# Patient Record
Sex: Female | Born: 1980 | Race: Black or African American | Hispanic: No | Marital: Single | Smoking: Never smoker
Health system: Southern US, Community
[De-identification: ages and names within clinical notes are randomized; demographics above are authoritative.]

## PROBLEM LIST (undated history)

## (undated) DIAGNOSIS — D649 Anemia, unspecified: Secondary | ICD-10-CM

## (undated) DIAGNOSIS — D219 Benign neoplasm of connective and other soft tissue, unspecified: Secondary | ICD-10-CM

## (undated) HISTORY — PX: TUBAL LIGATION: SHX77

## (undated) HISTORY — DX: Anemia, unspecified: D64.9

## (undated) HISTORY — DX: Benign neoplasm of connective and other soft tissue, unspecified: D21.9

---
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
## 2000-09-25 ENCOUNTER — Inpatient Hospital Stay (HOSPITAL_COMMUNITY): Admission: AD | Admit: 2000-09-25 | Discharge: 2000-09-25 | Payer: Self-pay | Admitting: *Deleted

## 2000-09-25 ENCOUNTER — Encounter: Payer: Self-pay | Admitting: Obstetrics

## 2000-09-27 ENCOUNTER — Inpatient Hospital Stay (HOSPITAL_COMMUNITY): Admission: AD | Admit: 2000-09-27 | Discharge: 2000-09-27 | Payer: Self-pay | Admitting: Gynecology

## 2000-09-30 ENCOUNTER — Encounter (HOSPITAL_COMMUNITY): Admission: RE | Admit: 2000-09-30 | Discharge: 2000-10-01 | Payer: Self-pay | Admitting: *Deleted

## 2000-10-02 ENCOUNTER — Inpatient Hospital Stay (HOSPITAL_COMMUNITY): Admission: AD | Admit: 2000-10-02 | Discharge: 2000-10-04 | Payer: Self-pay | Admitting: *Deleted

## 2002-07-09 ENCOUNTER — Inpatient Hospital Stay (HOSPITAL_COMMUNITY): Admission: AD | Admit: 2002-07-09 | Discharge: 2002-07-09 | Payer: Self-pay | Admitting: *Deleted

## 2003-01-09 ENCOUNTER — Emergency Department (HOSPITAL_COMMUNITY): Admission: EM | Admit: 2003-01-09 | Discharge: 2003-01-10 | Payer: Self-pay | Admitting: Emergency Medicine

## 2003-01-09 ENCOUNTER — Encounter (INDEPENDENT_AMBULATORY_CARE_PROVIDER_SITE_OTHER): Payer: Self-pay | Admitting: *Deleted

## 2003-01-10 ENCOUNTER — Encounter: Payer: Self-pay | Admitting: Emergency Medicine

## 2003-04-20 ENCOUNTER — Inpatient Hospital Stay (HOSPITAL_COMMUNITY): Admission: AD | Admit: 2003-04-20 | Discharge: 2003-04-20 | Payer: Self-pay | Admitting: Family Medicine

## 2003-04-20 ENCOUNTER — Encounter: Payer: Self-pay | Admitting: Family Medicine

## 2003-07-17 ENCOUNTER — Ambulatory Visit (HOSPITAL_COMMUNITY): Admission: RE | Admit: 2003-07-17 | Discharge: 2003-07-17 | Payer: Self-pay | Admitting: Obstetrics & Gynecology

## 2003-07-30 ENCOUNTER — Inpatient Hospital Stay (HOSPITAL_COMMUNITY): Admission: AD | Admit: 2003-07-30 | Discharge: 2003-07-31 | Payer: Self-pay | Admitting: *Deleted

## 2003-10-13 ENCOUNTER — Inpatient Hospital Stay (HOSPITAL_COMMUNITY): Admission: AD | Admit: 2003-10-13 | Discharge: 2003-10-13 | Payer: Self-pay | Admitting: Obstetrics & Gynecology

## 2003-10-15 ENCOUNTER — Inpatient Hospital Stay (HOSPITAL_COMMUNITY): Admission: AD | Admit: 2003-10-15 | Discharge: 2003-10-15 | Payer: Self-pay | Admitting: Obstetrics

## 2003-10-16 ENCOUNTER — Ambulatory Visit (HOSPITAL_COMMUNITY): Admission: AD | Admit: 2003-10-16 | Discharge: 2003-10-16 | Payer: Self-pay | Admitting: Obstetrics & Gynecology

## 2003-10-19 ENCOUNTER — Inpatient Hospital Stay (HOSPITAL_COMMUNITY): Admission: AD | Admit: 2003-10-19 | Discharge: 2003-10-25 | Payer: Self-pay | Admitting: Obstetrics

## 2004-08-04 ENCOUNTER — Emergency Department (HOSPITAL_COMMUNITY): Admission: EM | Admit: 2004-08-04 | Discharge: 2004-08-04 | Payer: Self-pay

## 2005-01-14 ENCOUNTER — Emergency Department (HOSPITAL_COMMUNITY): Admission: EM | Admit: 2005-01-14 | Discharge: 2005-01-14 | Payer: Self-pay | Admitting: Family Medicine

## 2005-12-20 ENCOUNTER — Emergency Department (HOSPITAL_COMMUNITY): Admission: EM | Admit: 2005-12-20 | Discharge: 2005-12-20 | Payer: Self-pay | Admitting: Emergency Medicine

## 2006-05-24 ENCOUNTER — Emergency Department (HOSPITAL_COMMUNITY): Admission: EM | Admit: 2006-05-24 | Discharge: 2006-05-25 | Payer: Self-pay | Admitting: Emergency Medicine

## 2007-01-21 ENCOUNTER — Emergency Department (HOSPITAL_COMMUNITY): Admission: EM | Admit: 2007-01-21 | Discharge: 2007-01-21 | Payer: Self-pay | Admitting: Emergency Medicine

## 2007-09-18 ENCOUNTER — Emergency Department (HOSPITAL_COMMUNITY): Admission: EM | Admit: 2007-09-18 | Discharge: 2007-09-18 | Payer: Self-pay | Admitting: Emergency Medicine

## 2007-09-20 ENCOUNTER — Inpatient Hospital Stay (HOSPITAL_COMMUNITY): Admission: AD | Admit: 2007-09-20 | Discharge: 2007-09-21 | Payer: Self-pay | Admitting: Gynecology

## 2007-11-27 ENCOUNTER — Emergency Department (HOSPITAL_COMMUNITY): Admission: EM | Admit: 2007-11-27 | Discharge: 2007-11-27 | Payer: Self-pay | Admitting: Emergency Medicine

## 2008-08-27 ENCOUNTER — Inpatient Hospital Stay (HOSPITAL_COMMUNITY): Admission: AD | Admit: 2008-08-27 | Discharge: 2008-08-27 | Payer: Self-pay | Admitting: Obstetrics & Gynecology

## 2008-11-10 ENCOUNTER — Emergency Department (HOSPITAL_COMMUNITY): Admission: EM | Admit: 2008-11-10 | Discharge: 2008-11-10 | Payer: Self-pay | Admitting: Emergency Medicine

## 2009-02-25 ENCOUNTER — Inpatient Hospital Stay (HOSPITAL_COMMUNITY): Admission: AD | Admit: 2009-02-25 | Discharge: 2009-02-26 | Payer: Self-pay | Admitting: Obstetrics & Gynecology

## 2009-08-06 ENCOUNTER — Inpatient Hospital Stay (HOSPITAL_COMMUNITY): Admission: AD | Admit: 2009-08-06 | Discharge: 2009-08-06 | Payer: Self-pay | Admitting: Obstetrics & Gynecology

## 2009-08-18 ENCOUNTER — Inpatient Hospital Stay (HOSPITAL_COMMUNITY): Admission: AD | Admit: 2009-08-18 | Discharge: 2009-08-18 | Payer: Self-pay | Admitting: Obstetrics and Gynecology

## 2009-10-25 ENCOUNTER — Ambulatory Visit (HOSPITAL_COMMUNITY): Admission: RE | Admit: 2009-10-25 | Discharge: 2009-10-25 | Payer: Self-pay | Admitting: Obstetrics & Gynecology

## 2009-10-26 ENCOUNTER — Inpatient Hospital Stay (HOSPITAL_COMMUNITY): Admission: AD | Admit: 2009-10-26 | Discharge: 2009-10-26 | Payer: Self-pay | Admitting: Maternal and Fetal Medicine

## 2009-11-22 ENCOUNTER — Ambulatory Visit (HOSPITAL_COMMUNITY): Admission: RE | Admit: 2009-11-22 | Discharge: 2009-11-22 | Payer: Self-pay | Admitting: Obstetrics & Gynecology

## 2009-12-20 ENCOUNTER — Ambulatory Visit (HOSPITAL_COMMUNITY): Admission: RE | Admit: 2009-12-20 | Discharge: 2009-12-20 | Payer: Self-pay | Admitting: Obstetrics & Gynecology

## 2010-01-12 ENCOUNTER — Inpatient Hospital Stay (HOSPITAL_COMMUNITY): Admission: AD | Admit: 2010-01-12 | Discharge: 2010-01-12 | Payer: Self-pay | Admitting: Obstetrics & Gynecology

## 2010-01-12 ENCOUNTER — Ambulatory Visit: Payer: Self-pay | Admitting: Advanced Practice Midwife

## 2010-01-17 ENCOUNTER — Ambulatory Visit (HOSPITAL_COMMUNITY): Admission: RE | Admit: 2010-01-17 | Discharge: 2010-01-17 | Payer: Self-pay | Admitting: Obstetrics & Gynecology

## 2010-01-21 ENCOUNTER — Inpatient Hospital Stay (HOSPITAL_COMMUNITY): Admission: AD | Admit: 2010-01-21 | Discharge: 2010-01-21 | Payer: Self-pay | Admitting: Obstetrics

## 2010-01-26 ENCOUNTER — Inpatient Hospital Stay (HOSPITAL_COMMUNITY): Admission: AD | Admit: 2010-01-26 | Discharge: 2010-01-27 | Payer: Self-pay | Admitting: Obstetrics & Gynecology

## 2010-02-14 ENCOUNTER — Ambulatory Visit (HOSPITAL_COMMUNITY): Admission: RE | Admit: 2010-02-14 | Discharge: 2010-02-14 | Payer: Self-pay | Admitting: Obstetrics & Gynecology

## 2010-02-23 ENCOUNTER — Ambulatory Visit: Payer: Self-pay | Admitting: Nurse Practitioner

## 2010-02-23 ENCOUNTER — Inpatient Hospital Stay (HOSPITAL_COMMUNITY): Admission: AD | Admit: 2010-02-23 | Discharge: 2010-02-24 | Payer: Self-pay | Admitting: Obstetrics & Gynecology

## 2010-03-06 ENCOUNTER — Inpatient Hospital Stay (HOSPITAL_COMMUNITY): Admission: AD | Admit: 2010-03-06 | Discharge: 2010-03-10 | Payer: Self-pay | Admitting: Obstetrics & Gynecology

## 2010-03-07 ENCOUNTER — Encounter: Payer: Self-pay | Admitting: Obstetrics & Gynecology

## 2010-08-11 ENCOUNTER — Emergency Department (HOSPITAL_COMMUNITY)
Admission: EM | Admit: 2010-08-11 | Discharge: 2010-08-11 | Payer: Self-pay | Source: Home / Self Care | Admitting: Emergency Medicine

## 2010-10-23 IMAGING — US US OB EACH ADDL GEST<[ID]
1 series · 13 of 27 positions shown · non-contrast
Comparison: Ultrasound of pregnancy performed 08/06/2009

CLINICAL DATA: Vaginal bleeding and lower back pain.

OBSTETRIC <14 WK ULTRASOUND (TWINS)
TECHNIQUE: Transabdominal ultrasound was performed for evaluation
of the gestation as well as the maternal uterus and adnexal
regions.

[Series 1: us ob comp less 14 wks · 0.27mm/px · 13 of 27 slices shown]
[im 2/27]
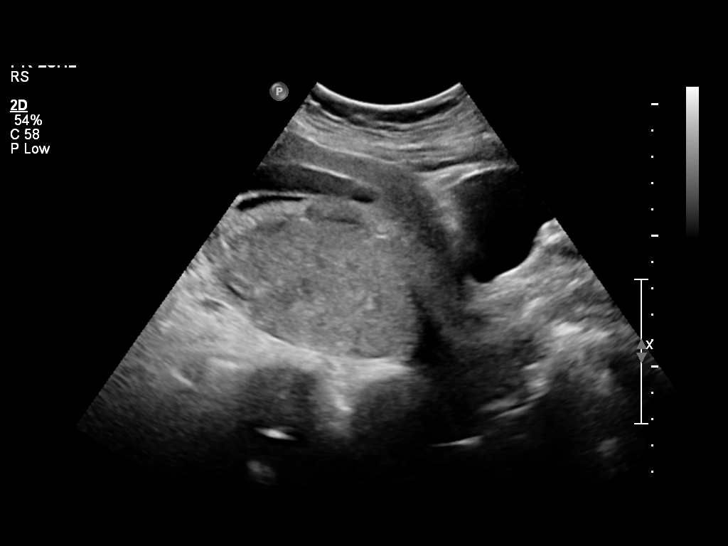
[im 4/27]
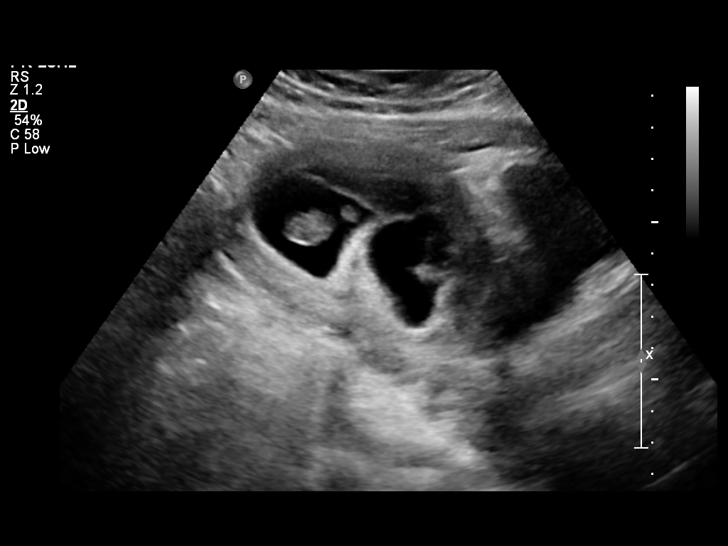
[im 6/27]
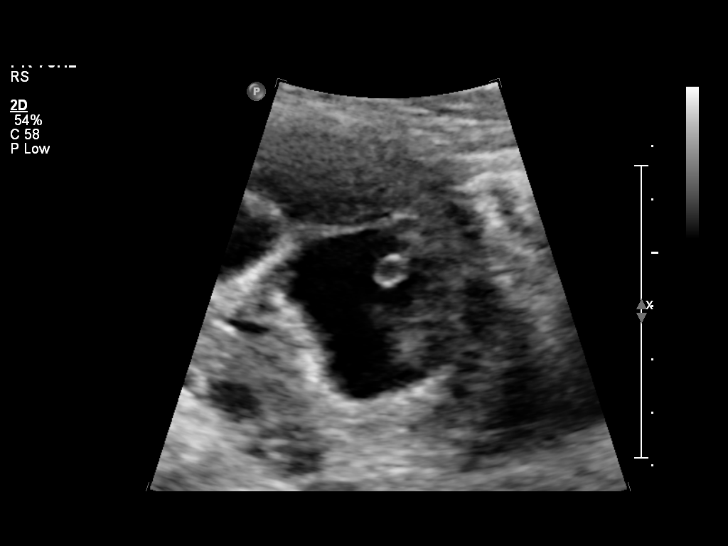
[im 8/27]
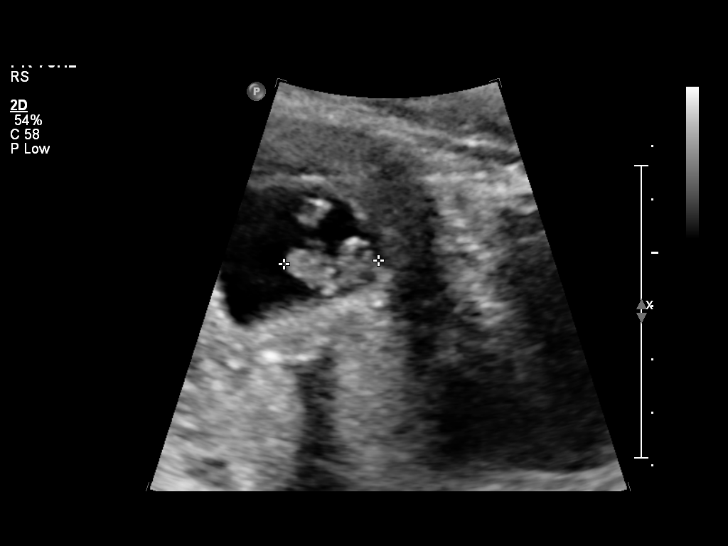
[im 10/27]
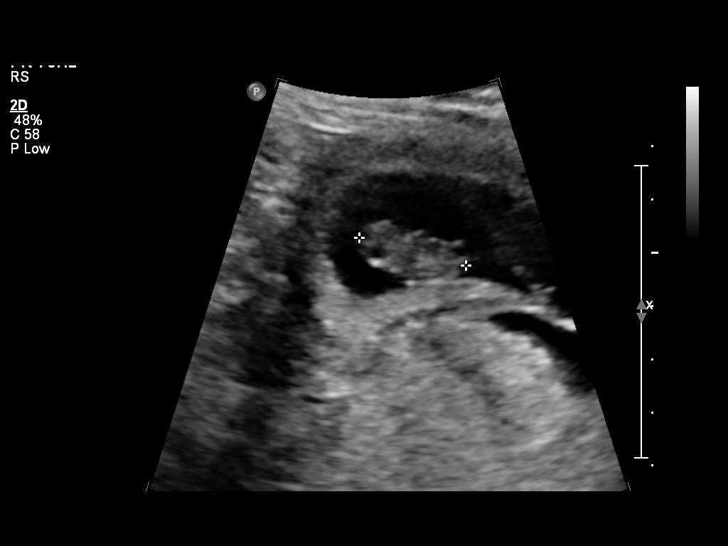
[im 12/27]
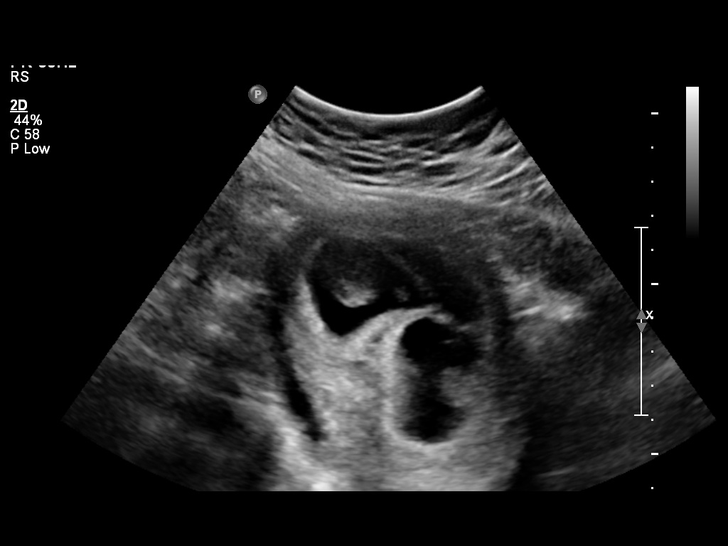
[im 14/27]
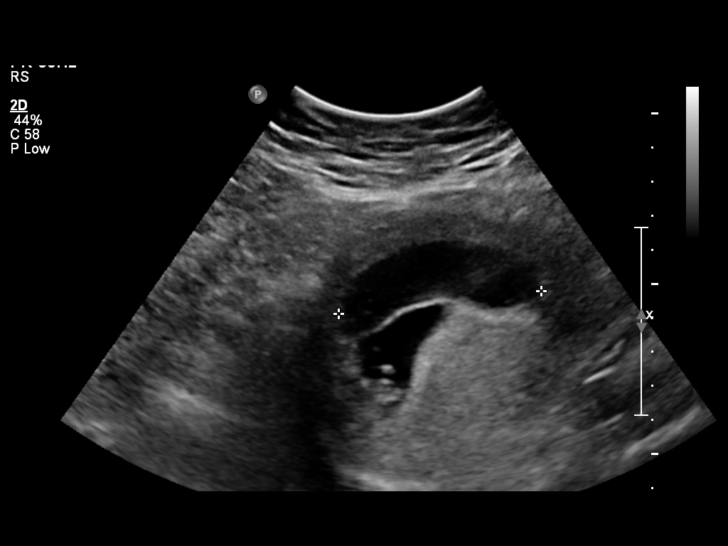
[im 16/27]
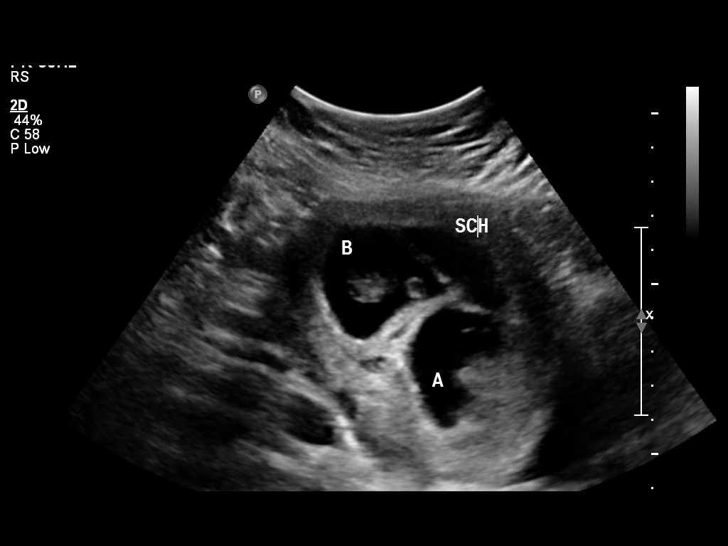
[im 18/27]
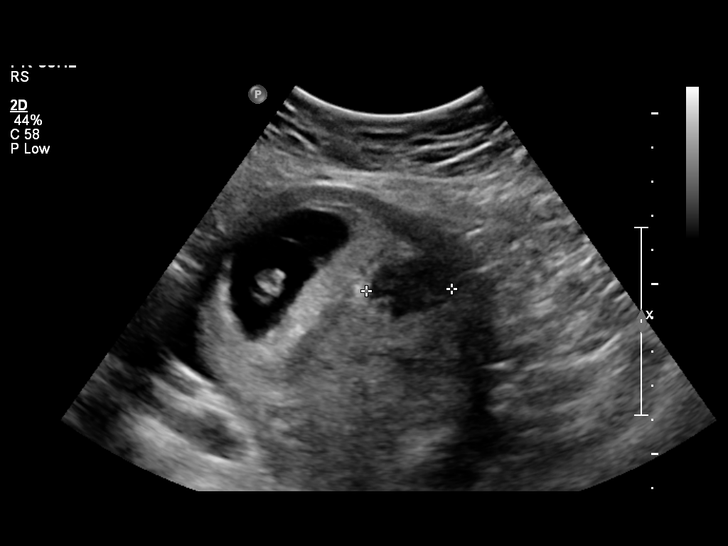
[im 20/27]
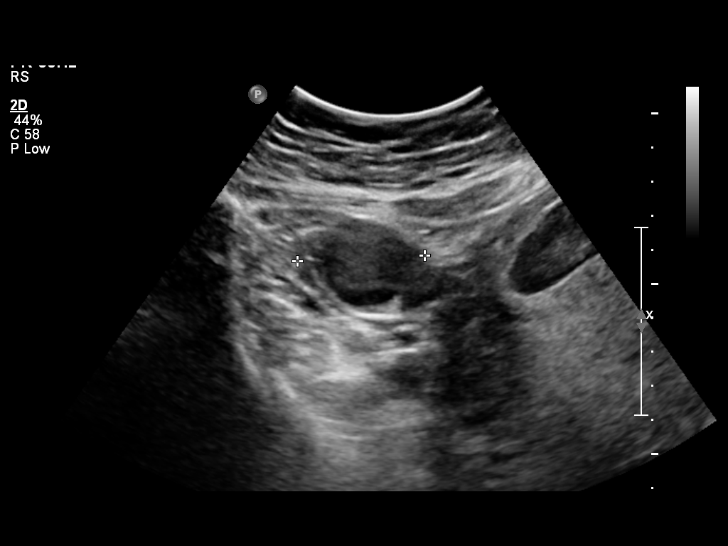
[im 22/27]
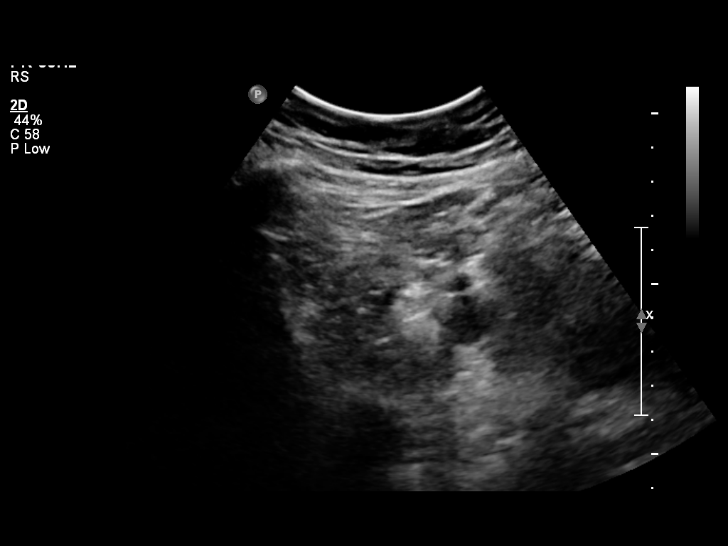
[im 24/27]
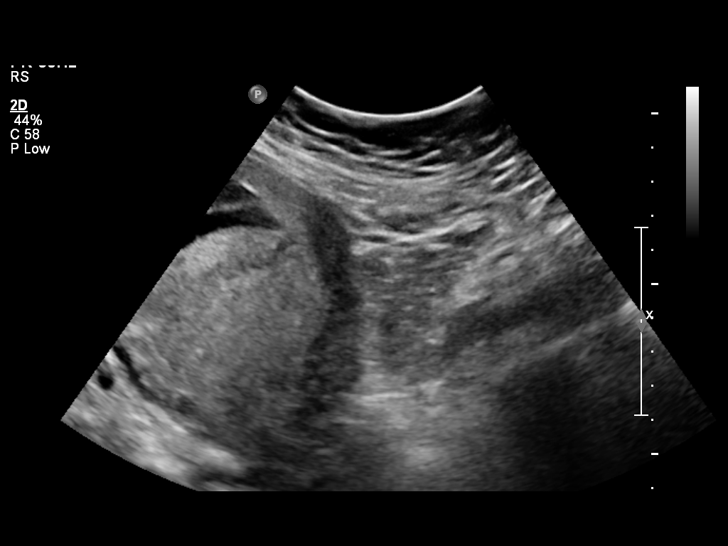
[im 26/27]
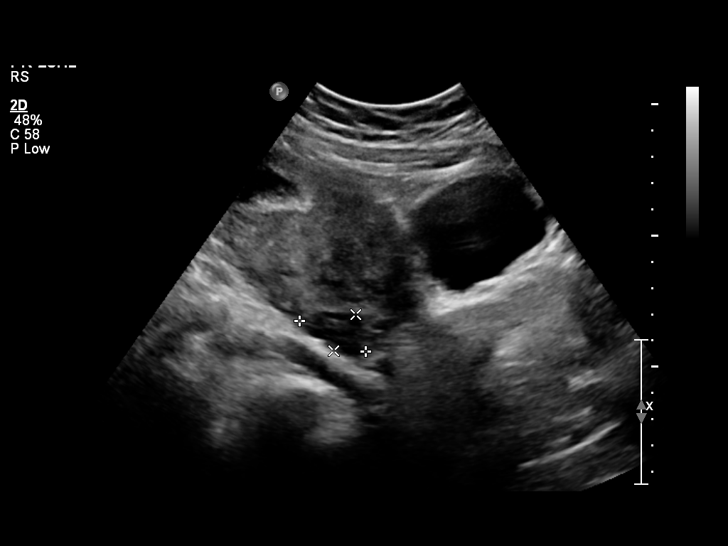

[13 of 27 positions shown; findings below may reference images not displayed]

FINDINGS: Two intrauterine gestational sacs are identified.
Embryos are seen within both intrauterine gestational sacs.  Twin A
has a crown-rump length of 1.93 cm, corresponding to a gestational
age of 8 weeks 4 days.  Twin B has a crown-rump length of 2.07 cm,
corresponding to a gestational age of 8 weeks 5 days.  Cardiac
activity is noted within both twins.  The heart rate of twin A is
169 beats per minute, while the heart rate of twin B is 170 beats
per minute.  Yolk sacs are noted for both twin A and twin B.

A moderate amount of subchorionic hemorrhage is again noted.  This
measures approximately 6.0 x 1.3 x 4.7 cm, perhaps slightly less
prominent than on the prior study. A small fibroid is again
identified.  Myometrial obvious is otherwise within normal limits.

The ovaries are unremarkable in appearance.  A corpus luteal cyst
is noted on the right side.  The right ovary measures 5.1 x 2.8 x
3.7 cm, while the left ovary measures 2.8 x 1.6 x 1.9 cm.  No
suspicious adnexal masses are seen.  There is no evidence of
ectopic pregnancy at this time.

No free fluid is seen within the pelvic cul-de-sac.
IMPRESSION: 1.  Two live intrauterine pregnancies identified; twin A has a
crown-rump length of 1.93 cm, corresponding to a gestational age of
8 weeks 4 days, while twin B has a crown-rump length of 2.07 cm,
corresponding to a gestational age of 8 weeks 5 days.  This
corresponds well with the first ultrasound, reflecting a estimated
date of delivery March 28, 2010.
2.  Moderate subchorionic hemorrhage again noted.
3.  Small fibroid again seen.

## 2010-11-08 LAB — CBC
HCT: 24.7 % — ABNORMAL LOW (ref 36.0–46.0)
HCT: 25.7 % — ABNORMAL LOW (ref 36.0–46.0)
Hemoglobin: 8.1 g/dL — ABNORMAL LOW (ref 12.0–15.0)
Hemoglobin: 8.4 g/dL — ABNORMAL LOW (ref 12.0–15.0)
RBC: 3.24 MIL/uL — ABNORMAL LOW (ref 3.87–5.11)
WBC: 15.6 10*3/uL — ABNORMAL HIGH (ref 4.0–10.5)
WBC: 8.3 10*3/uL (ref 4.0–10.5)

## 2010-11-08 LAB — MRSA PCR SCREENING: MRSA by PCR: NEGATIVE

## 2010-11-09 LAB — CBC
MCH: 27 pg (ref 26.0–34.0)
Platelets: 239 10*3/uL (ref 150–400)
RBC: 3.29 MIL/uL — ABNORMAL LOW (ref 3.87–5.11)
RDW: 15.4 % (ref 11.5–15.5)
WBC: 8.1 10*3/uL (ref 4.0–10.5)

## 2010-11-09 LAB — COMPREHENSIVE METABOLIC PANEL
AST: 24 U/L (ref 0–37)
Albumin: 2.7 g/dL — ABNORMAL LOW (ref 3.5–5.2)
Calcium: 8.9 mg/dL (ref 8.4–10.5)
Chloride: 103 mEq/L (ref 96–112)
Creatinine, Ser: 0.63 mg/dL (ref 0.4–1.2)
GFR calc Af Amer: >60 mL/min (ref >60)
Total Bilirubin: 1.5 mg/dL — ABNORMAL HIGH (ref 0.3–1.2)
Total Protein: 5.8 g/dL — ABNORMAL LOW (ref 6.0–8.3)

## 2010-11-09 LAB — URINE MICROSCOPIC-ADD ON

## 2010-11-09 LAB — URINALYSIS, ROUTINE W REFLEX MICROSCOPIC
Glucose, UA: NEGATIVE mg/dL
Hgb urine dipstick: NEGATIVE
Specific Gravity, Urine: 1.015 (ref 1.005–1.030)
Urobilinogen, UA: 4 mg/dL — ABNORMAL HIGH (ref 0.0–1.0)

## 2010-11-10 LAB — COMPREHENSIVE METABOLIC PANEL
Alkaline Phosphatase: 160 U/L — ABNORMAL HIGH (ref 39–117)
BUN: 2 mg/dL — ABNORMAL LOW (ref 6–23)
CO2: 24 mEq/L (ref 19–32)
Chloride: 105 mEq/L (ref 96–112)
Creatinine, Ser: 0.52 mg/dL (ref 0.4–1.2)
GFR calc non Af Amer: >60 mL/min (ref >60)
Glucose, Bld: 62 mg/dL — ABNORMAL LOW (ref 70–99)
Potassium: 4 mEq/L (ref 3.5–5.1)
Total Bilirubin: 0.7 mg/dL (ref 0.3–1.2)

## 2010-11-10 LAB — URINALYSIS, ROUTINE W REFLEX MICROSCOPIC
Bilirubin Urine: NEGATIVE
Glucose, UA: NEGATIVE mg/dL
Glucose, UA: NEGATIVE mg/dL
Glucose, UA: NEGATIVE mg/dL
Hgb urine dipstick: NEGATIVE
Hgb urine dipstick: NEGATIVE
Hgb urine dipstick: NEGATIVE
Protein, ur: NEGATIVE mg/dL
Specific Gravity, Urine: 1.02 (ref 1.005–1.030)
Specific Gravity, Urine: 1.02 (ref 1.005–1.030)
Specific Gravity, Urine: 1.025 (ref 1.005–1.030)
Urobilinogen, UA: 2 mg/dL — ABNORMAL HIGH (ref 0.0–1.0)
pH: 6 (ref 5.0–8.0)
pH: 6 (ref 5.0–8.0)

## 2010-11-10 LAB — URINE MICROSCOPIC-ADD ON

## 2010-11-10 LAB — CBC
HCT: 27.1 % — ABNORMAL LOW (ref 36.0–46.0)
Hemoglobin: 9.3 g/dL — ABNORMAL LOW (ref 12.0–15.0)
MCV: 87.8 fL (ref 78.0–100.0)
WBC: 8.9 10*3/uL (ref 4.0–10.5)

## 2010-11-10 LAB — GC/CHLAMYDIA PROBE AMP, GENITAL
Chlamydia, DNA Probe: NEGATIVE
GC Probe Amp, Genital: NEGATIVE
GC Probe Amp, Genital: NEGATIVE

## 2010-11-10 LAB — URINE CULTURE

## 2010-11-10 LAB — WET PREP, GENITAL
Trich, Wet Prep: NONE SEEN
Yeast Wet Prep HPF POC: NONE SEEN

## 2010-11-24 LAB — URINE MICROSCOPIC-ADD ON

## 2010-11-24 LAB — URINALYSIS, ROUTINE W REFLEX MICROSCOPIC
Glucose, UA: NEGATIVE mg/dL
Specific Gravity, Urine: 1.01 (ref 1.005–1.030)
Urobilinogen, UA: 0.2 mg/dL (ref 0.0–1.0)
pH: 6 (ref 5.0–8.0)

## 2010-11-25 LAB — CBC
HCT: 33.5 % — ABNORMAL LOW (ref 36.0–46.0)
Hemoglobin: 11.3 g/dL — ABNORMAL LOW (ref 12.0–15.0)
MCHC: 33.6 g/dL (ref 30.0–36.0)
MCV: 90.9 fL (ref 78.0–100.0)
RDW: 13.8 % (ref 11.5–15.5)

## 2010-11-25 LAB — WET PREP, GENITAL
Trich, Wet Prep: NONE SEEN
Yeast Wet Prep HPF POC: NONE SEEN

## 2010-11-25 LAB — POCT PREGNANCY, URINE: Preg Test, Ur: POSITIVE

## 2010-11-30 LAB — URINALYSIS, ROUTINE W REFLEX MICROSCOPIC
Glucose, UA: NEGATIVE mg/dL
Ketones, ur: NEGATIVE mg/dL
Leukocytes, UA: NEGATIVE
Protein, ur: NEGATIVE mg/dL

## 2010-11-30 LAB — CBC
Hemoglobin: 11.9 g/dL — ABNORMAL LOW (ref 12.0–15.0)
MCHC: 34.7 g/dL (ref 30.0–36.0)
RBC: 3.79 MIL/uL — ABNORMAL LOW (ref 3.87–5.11)

## 2010-11-30 LAB — URINE MICROSCOPIC-ADD ON

## 2010-11-30 LAB — POCT PREGNANCY, URINE: Preg Test, Ur: POSITIVE

## 2010-12-08 LAB — ABO/RH: ABO/RH(D): O POS

## 2010-12-08 LAB — CBC
HCT: 34.6 % — ABNORMAL LOW (ref 36.0–46.0)
Hemoglobin: 12.1 g/dL (ref 12.0–15.0)
MCHC: 35.1 g/dL (ref 30.0–36.0)
MCV: 88.5 fL (ref 78.0–100.0)
RBC: 3.91 MIL/uL (ref 3.87–5.11)
RDW: 12 % (ref 11.5–15.5)

## 2010-12-08 LAB — URINALYSIS, ROUTINE W REFLEX MICROSCOPIC
Glucose, UA: NEGATIVE mg/dL
Leukocytes, UA: NEGATIVE
Protein, ur: NEGATIVE mg/dL
Specific Gravity, Urine: 1.02 (ref 1.005–1.030)
pH: 5.5 (ref 5.0–8.0)

## 2010-12-08 LAB — URINE MICROSCOPIC-ADD ON

## 2010-12-08 LAB — WET PREP, GENITAL: Yeast Wet Prep HPF POC: NONE SEEN

## 2010-12-30 IMAGING — US US OB DETAIL EACH ADDL GEST + 14 WK
1 series · 14 of 28 positions shown · non-contrast
Comparison: none

OBSTETRICAL ULTRASOUND:
 This ultrasound was performed in The [HOSP[facility]], and the [facility] report will be stored to [REDACTED] PACS.  This report is also available in [HOSPITAL]?s accessANYware.

[Series 1: us ob detail each addl gest + 14 wk · 14 of 167 slices shown]
[im 7/167]
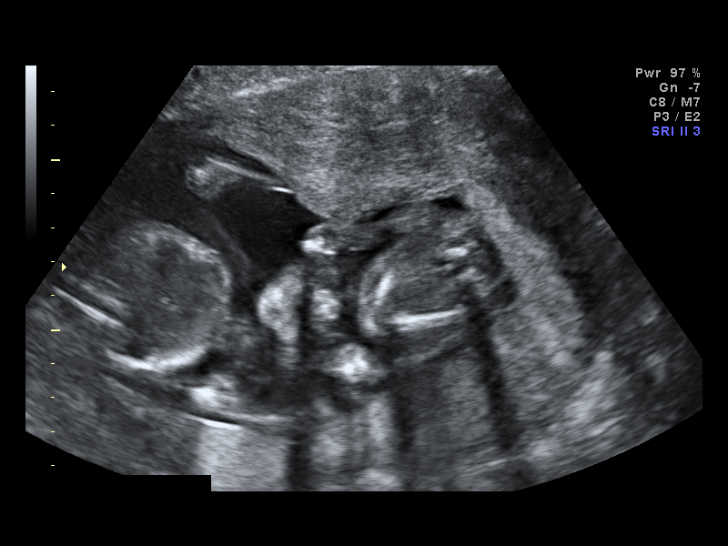
[im 19/167]
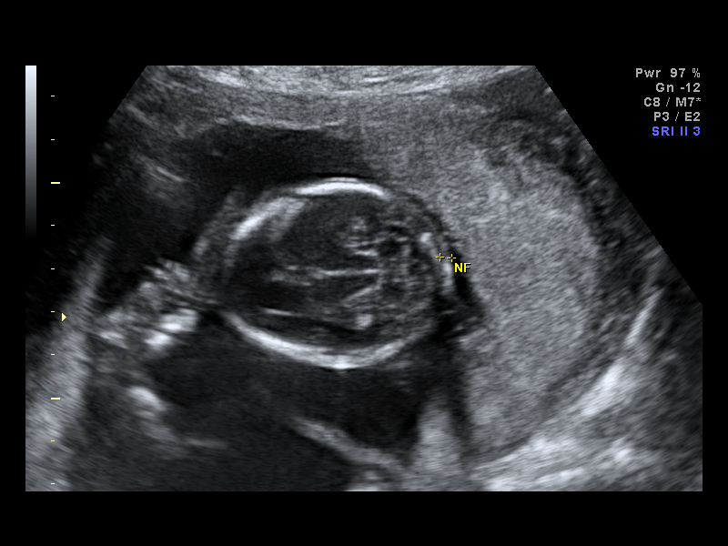
[im 31/167]
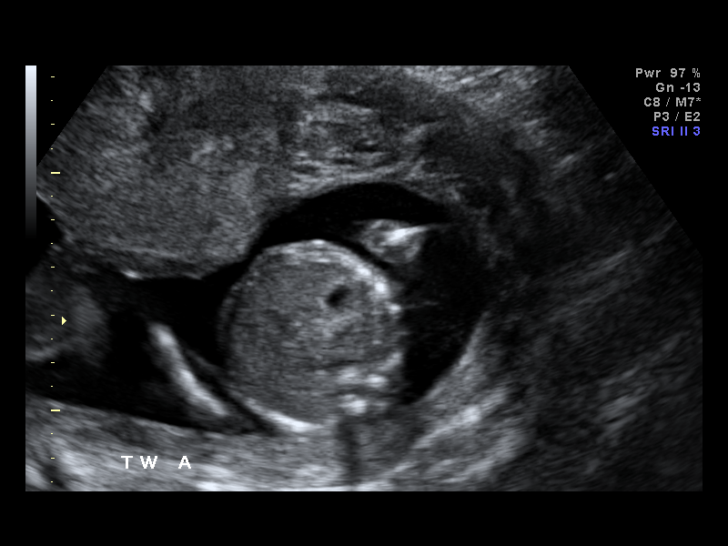
[im 44/167]
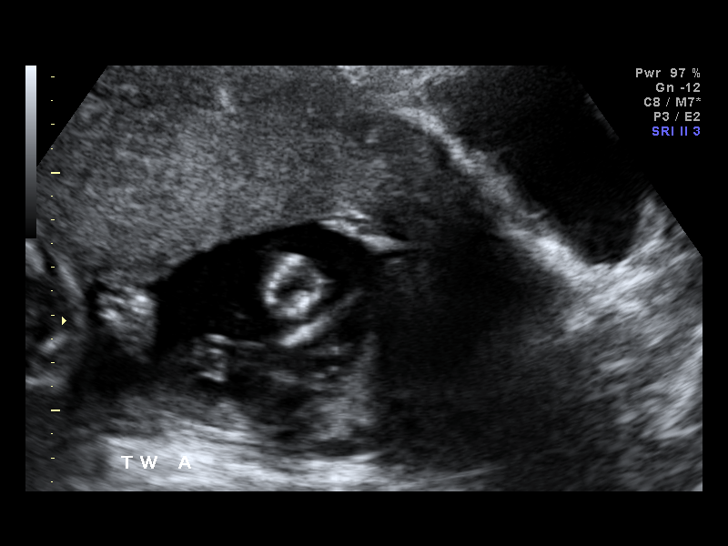
[im 56/167]
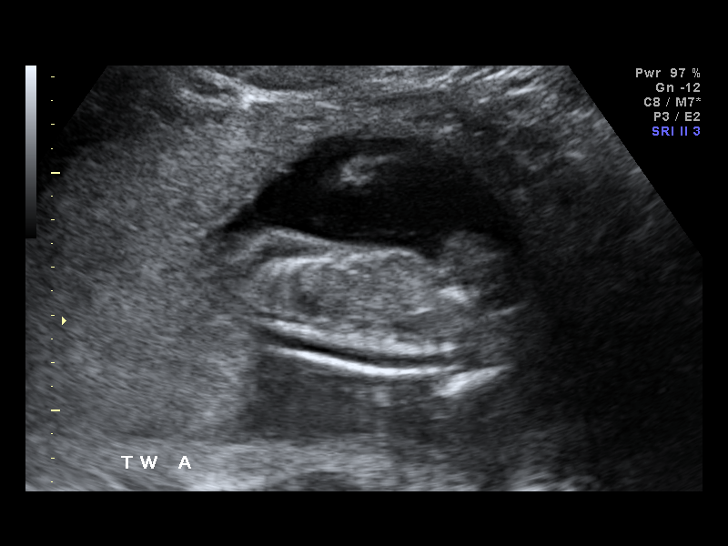
[im 68/167]
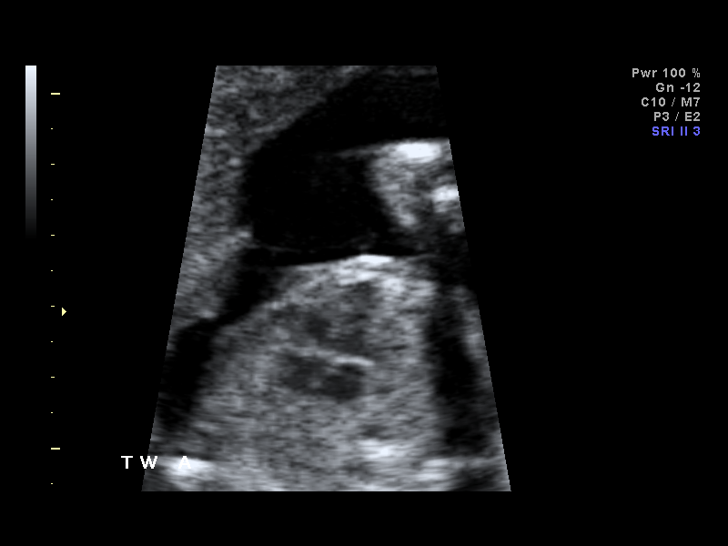
[im 80/167]
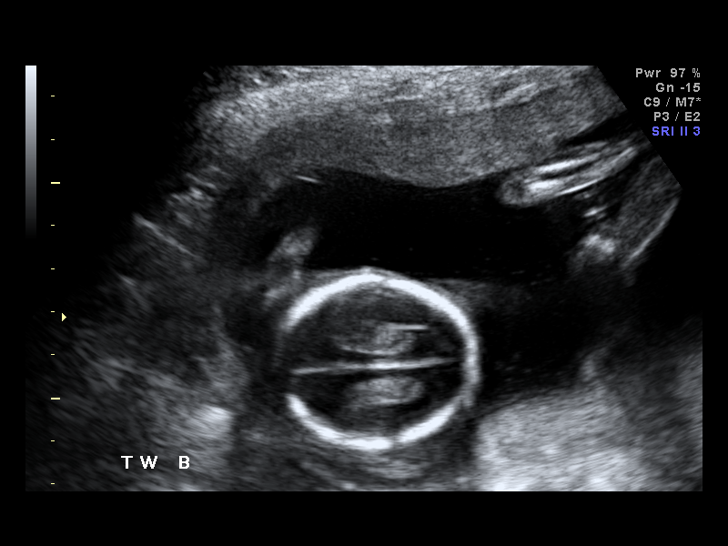
[im 93/167]
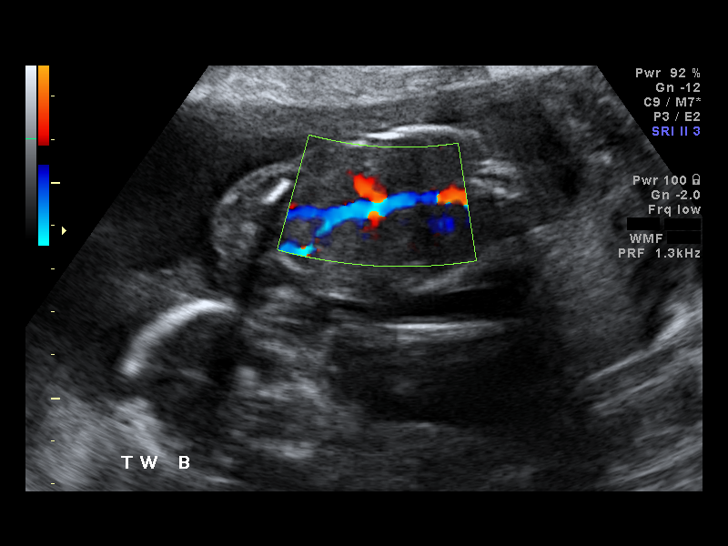
[im 105/167]
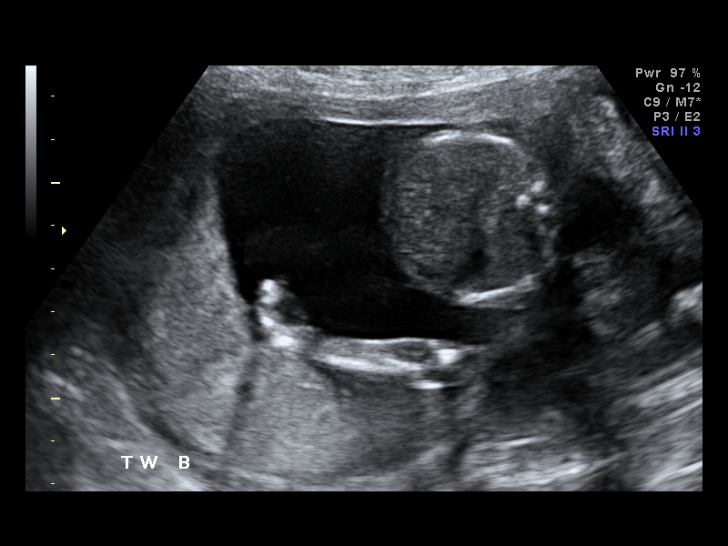
[im 117/167]
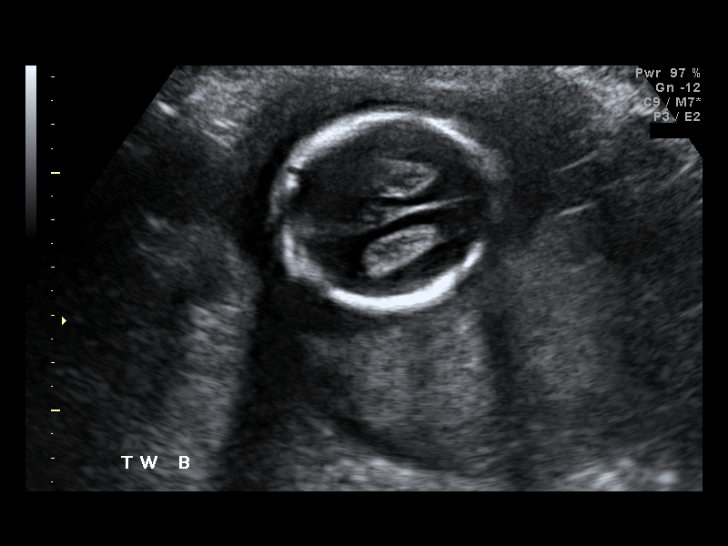
[im 130/167]
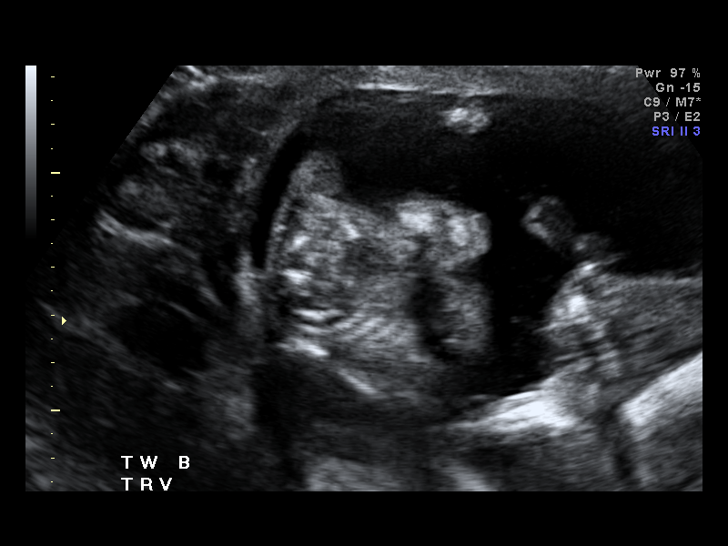
[im 142/167]
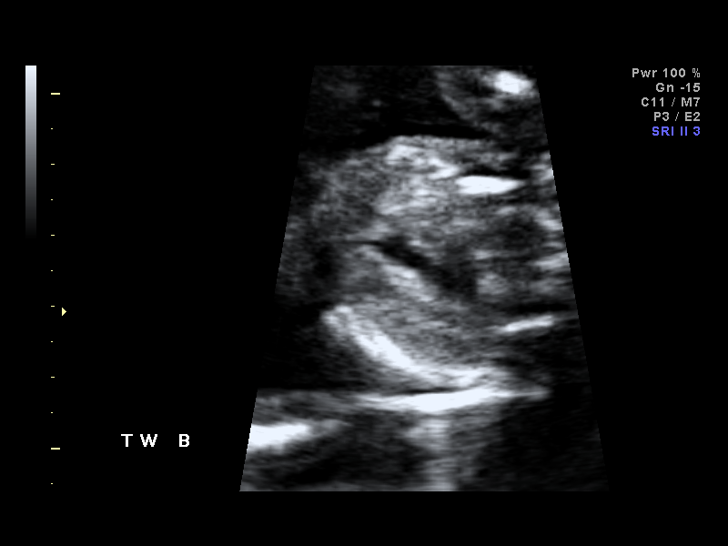
[im 154/167]
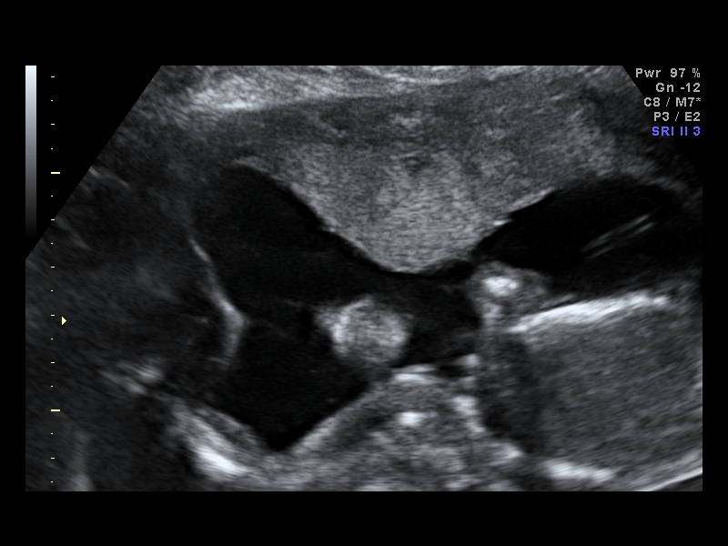
[im 167/167]
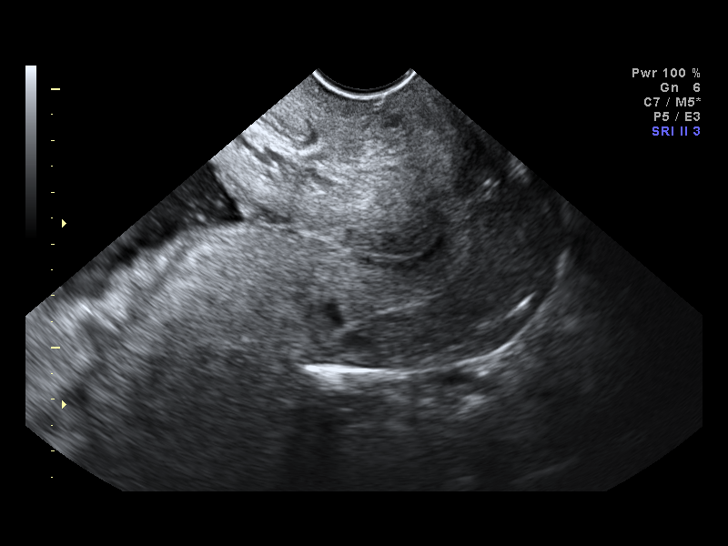

[14 of 28 positions shown; findings below may reference images not displayed]

IMPRESSION: [facility] has also been faxed to the ordering physician.

## 2011-01-09 NOTE — H&P (Signed)
Natalie Orr, Natalie Orr                     ACCOUNT NO.:  [id number]   MEDICAL RECORD NO.:  [id number]                   PATIENT TYPE:  INP   LOCATION:  9162                                 FACILITY:  [facility]   PHYSICIAN:  Charles A. Clearance Coots, M.D.             DATE OF BIRTH:  10/10/1980   DATE OF ADMISSION:  10/19/2003  DATE OF DISCHARGE:                                HISTORY & PHYSICAL   HISTORY OF PRESENT ILLNESS:  A 30 year old G8 P3-0-4-3 presents at [redacted] weeks  gestation with complaint of uterine contractions.  The patient is followed  at the [facility] with prenatal care starting at [redacted] weeks  gestation.  Her OB care thus far has been uncomplicated.   OBSTETRICAL HISTORY:  Previous obstetric history is significant for three  vaginal deliveries in 1997, in 2000, and in 2002.  In 1997 the patient  delivered at [redacted] weeks gestation and the delivery was complicated by  preeclampsia, postpartum hemorrhage.  The pregnancy was complicated by  intrauterine growth restriction.  In 2000 she delivered vaginally at [redacted]  weeks gestation.  There were no complications and the pregnancy was  uncomplicated.  In 2002 she delivered at 40 weeks vaginally and the labor  was complicated by preeclampsia.  She has had three therapeutic abortions  and one spontaneous abortion.   PAST MEDICAL HISTORY:  1. Surgery:  D&C for retained placenta in 1997.  2. Illnesses:  None.   MEDICATIONS:  Prenatal vitamins.   SOCIAL HISTORY:  Single.  Negative for tobacco, alcohol, or recreational  drug use.   PHYSICAL EXAMINATION:  GENERAL:  Well-nourished, well-developed black female  in no acute distress.  VITAL SIGNS:  Temperature 98.3, pulse 84, respiratory rate 16, blood  pressure 145/76.  HEENT:  Normal.  LUNGS:  Clear to auscultation bilaterally.  HEART:  Regular rate and rhythm.  ABDOMEN:  Gravid, nontender.  PELVIC:  Cervix 3 cm dilated, 50% effaced, vertex at a -3 to -2 station.   ADMITTING  LABORATORY VALUES:  Hemoglobin 10.6; hematocrit 31; white blood  cell count 7200; platelets 278,000.  SGOT was 13, SGPT was 9.  Glucose was  86.  Uric acid was 4.8, LDH was 142.  Urine was negative for protein,  specific gravity was 1.010.   IMPRESSION:  1. Thirty-seven weeks gestation.  2. Increased uterine activity with mild to moderate uterine contractions.  3. Pregnancy-induced hypertension, stable.   PLAN:  1. Admit.  2. Monitor blood pressures closely.  3. Rule out labor.                                               Charles A. Clearance Coots, M.D.    CAH/MEDQ  D:  10/20/2003  T:  10/20/2003  Job:  [id number]

## 2011-03-24 IMAGING — US US OB FOLLOW-UP
1 series · 14 of 28 positions shown · non-contrast
Comparison: none

OBSTETRICAL ULTRASOUND:
 This ultrasound was performed in The [facility]], and the [facility] report will be stored to [REDACTED] PACS.  This report is also available in [HOSPITAL]?s accessANYware.

[Series 1: us ob follow-up · 14 of 90 slices shown]
[im 4/90]
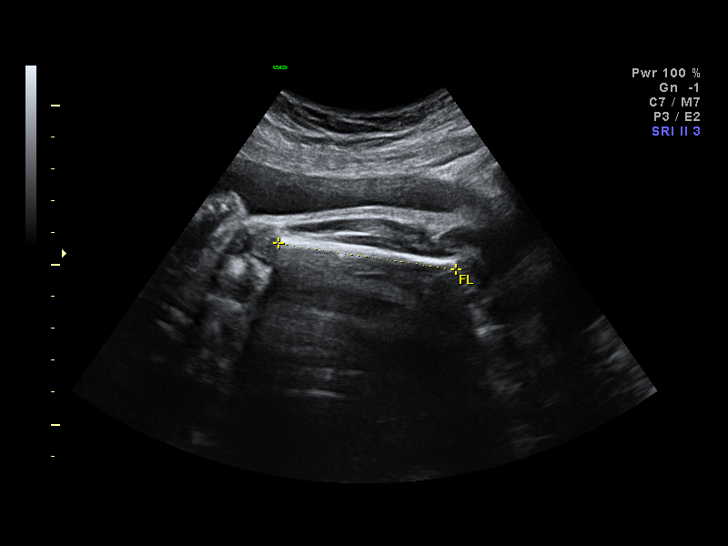
[im 10/90]
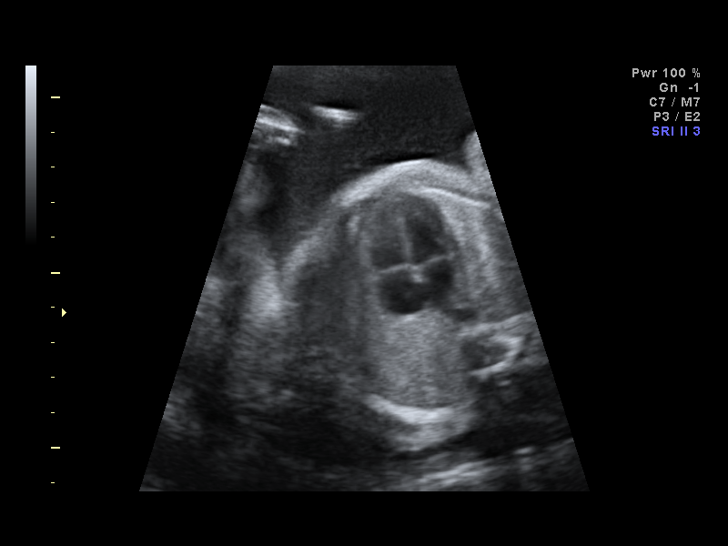
[im 17/90]
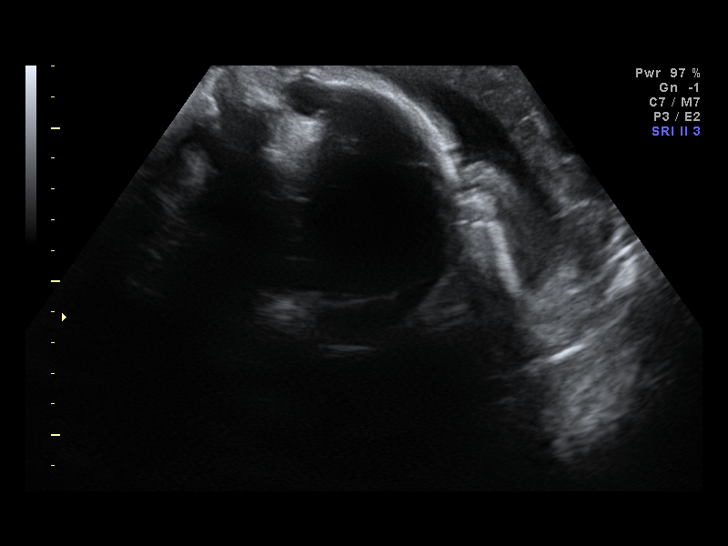
[im 24/90]
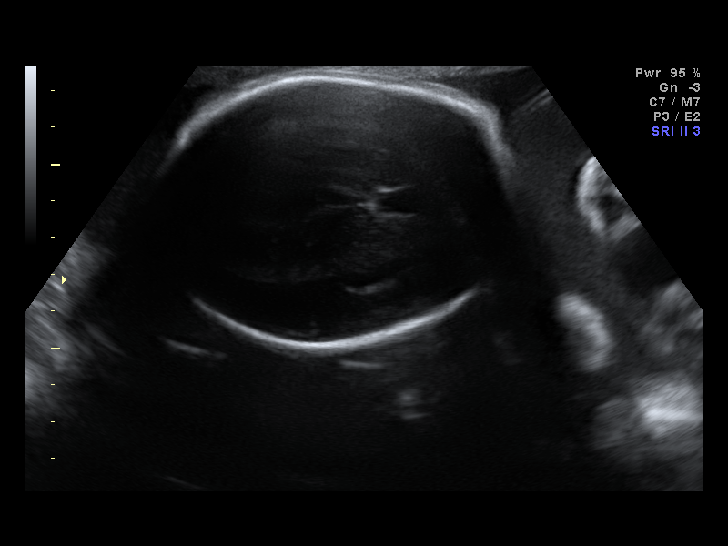
[im 30/90]
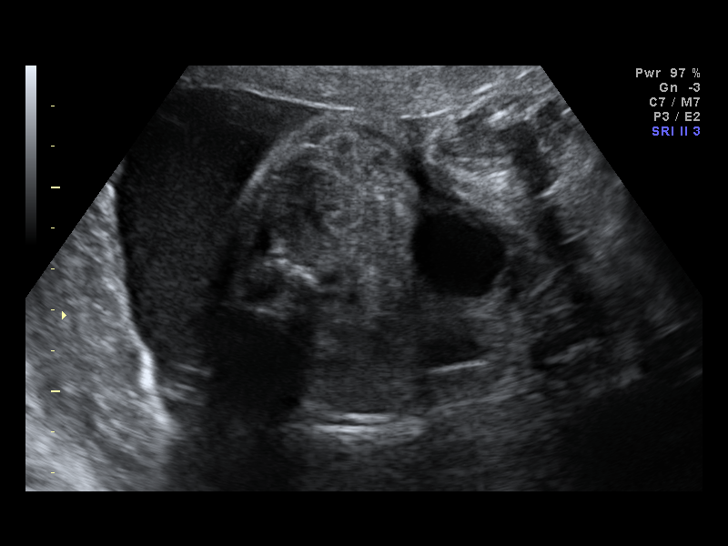
[im 37/90]
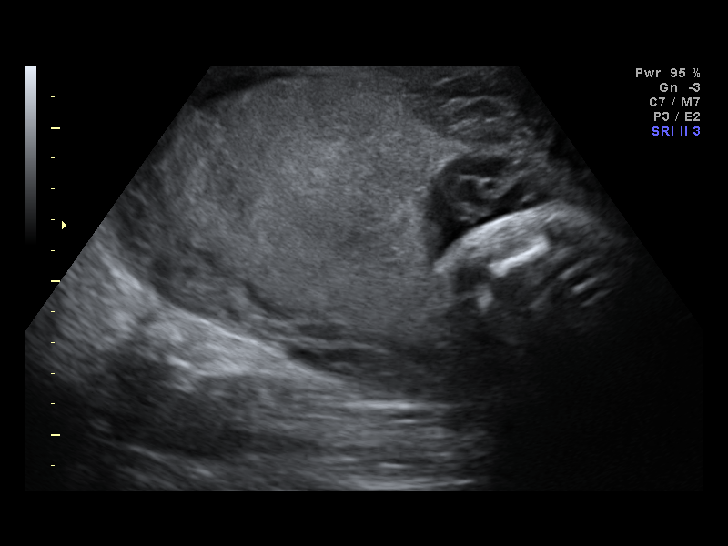
[im 43/90]
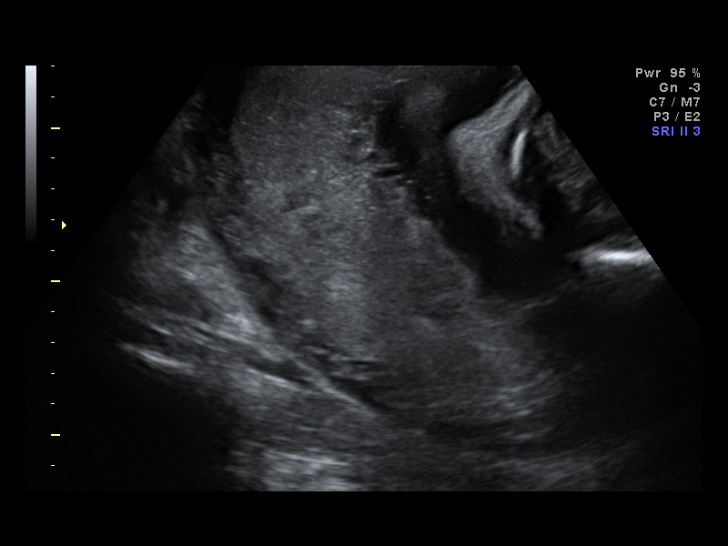
[im 50/90]
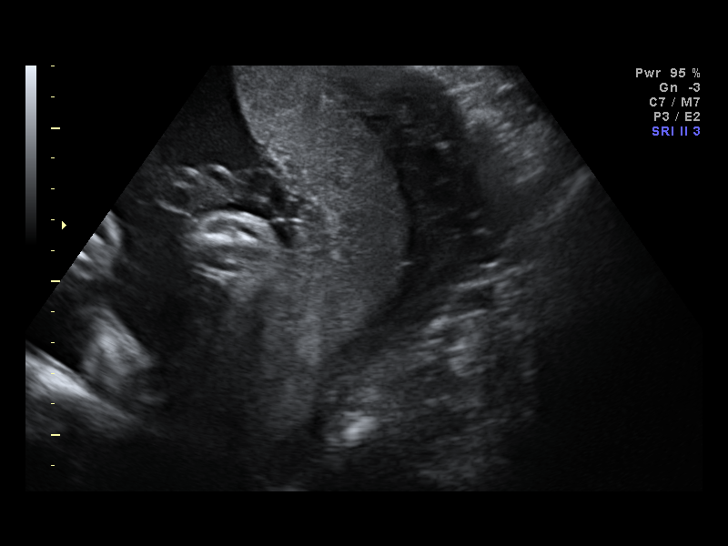
[im 57/90]
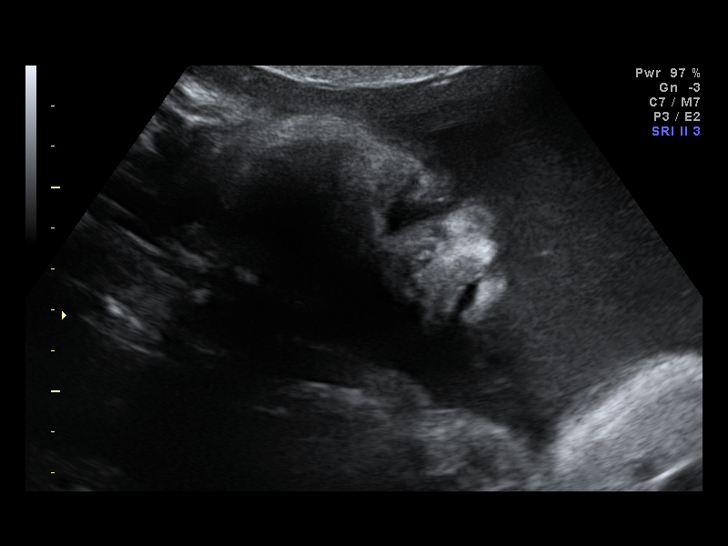
[im 63/90]
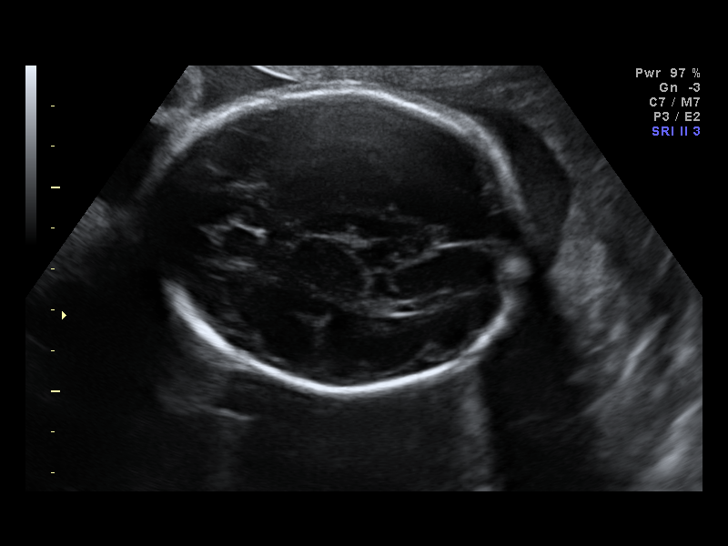
[im 70/90]
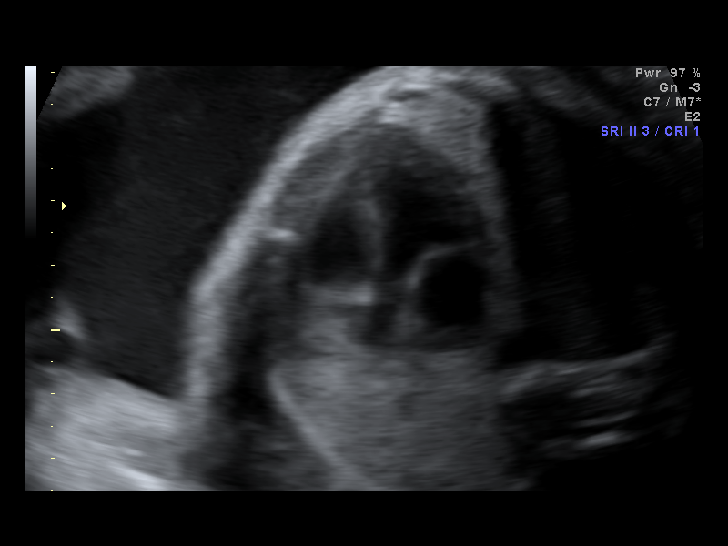
[im 76/90]
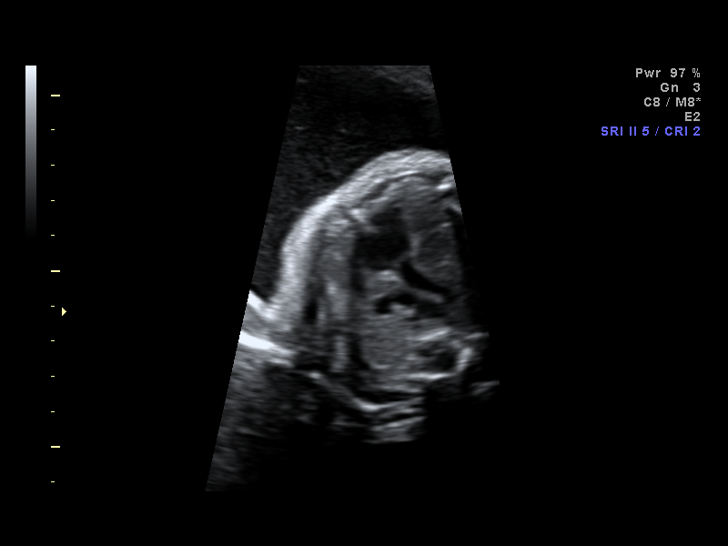
[im 83/90]
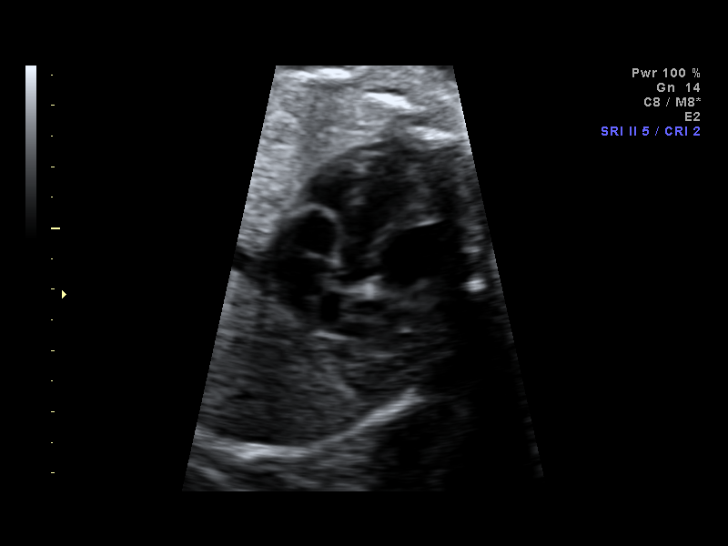
[im 90/90]
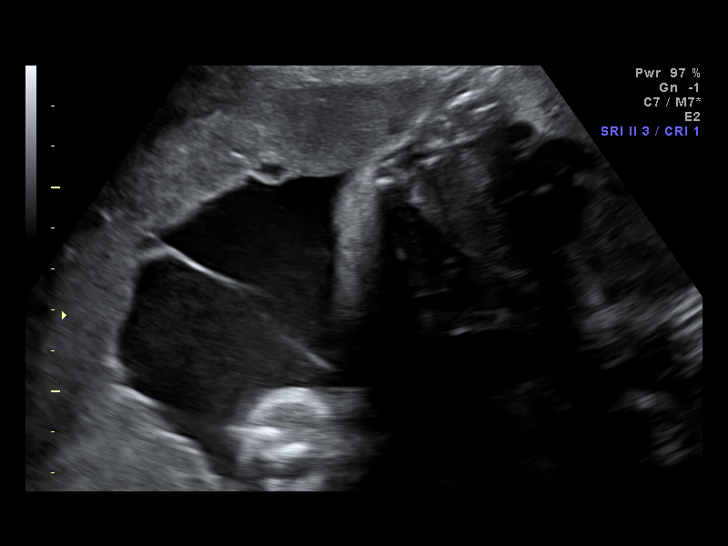

[14 of 28 positions shown; findings below may reference images not displayed]

IMPRESSION: [facility] has also been faxed to the ordering physician.

## 2011-05-11 IMAGING — US US FETAL BPP W/O NONSTRESS
1 series · 13 of 13 positions shown · non-contrast
Comparison: none

OBSTETRICAL ULTRASOUND:
 This ultrasound exam was performed in the [HOSPITAL] Ultrasound Department.  The OB US report was generated in the [facility] system, and faxed to the ordering physician.  This report is also available in [HOSPITAL]?s AccessANYware and in [REDACTED] PACS.

[Series 1: us fetal bpp w/o nonstress · non-contrast · 0.35mm/px · 13 acquisitions, 13 frames shown]
[im 1/13]
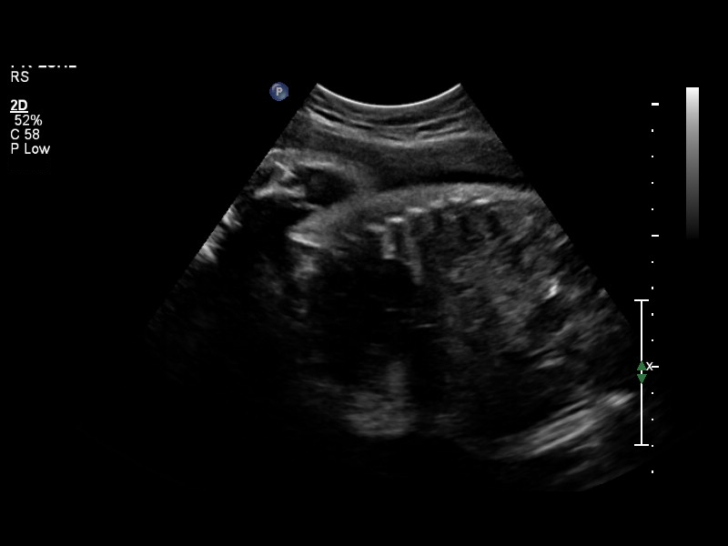
[im 2/13]
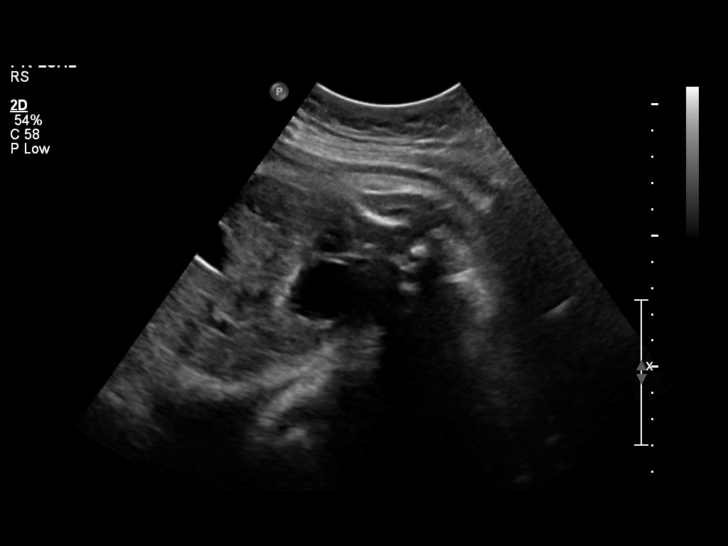
[im 3/13]
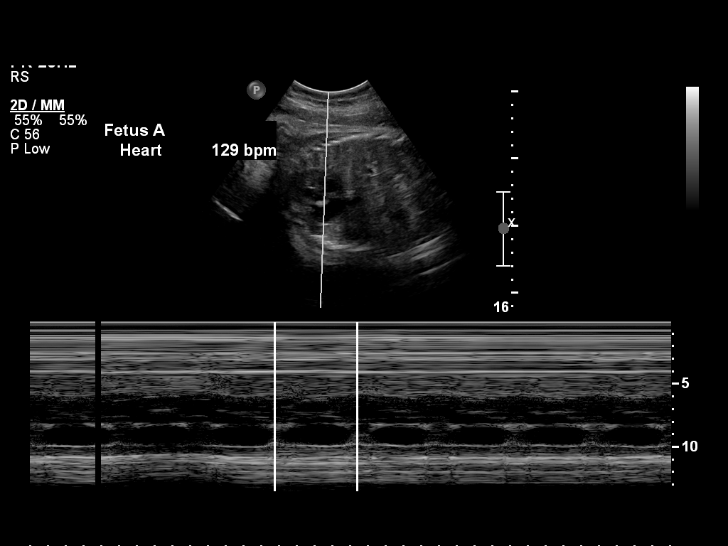
[im 4/13]
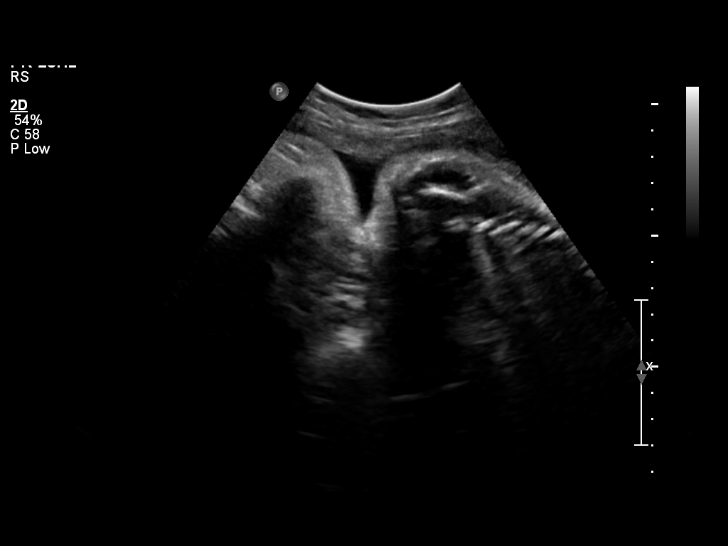
[im 5/13]
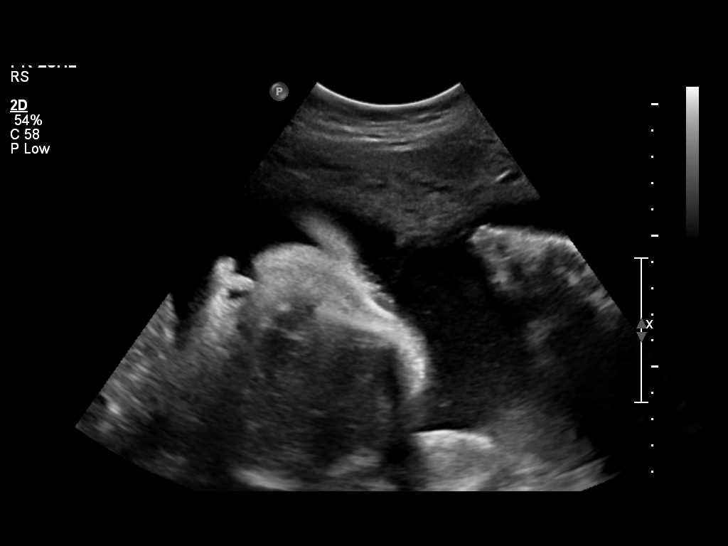
[im 6/13]
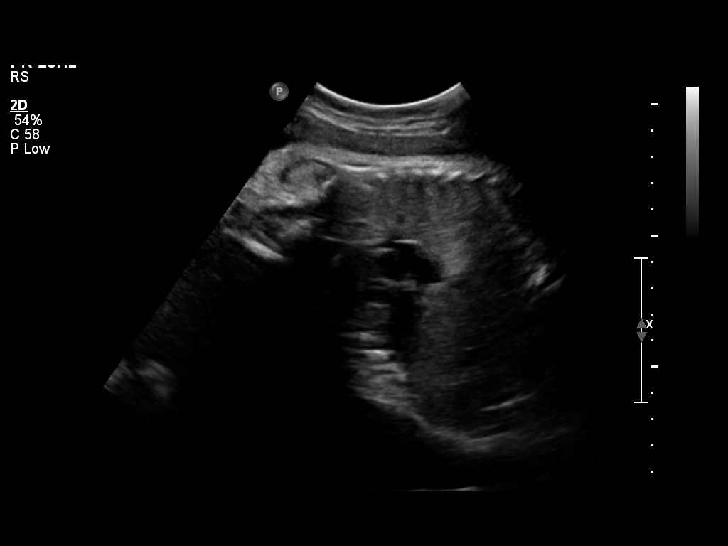
[im 7/13]
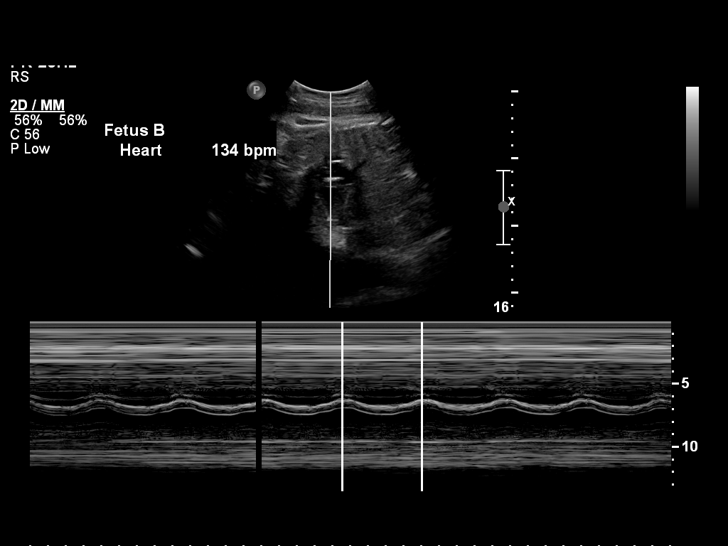
[im 8/13]
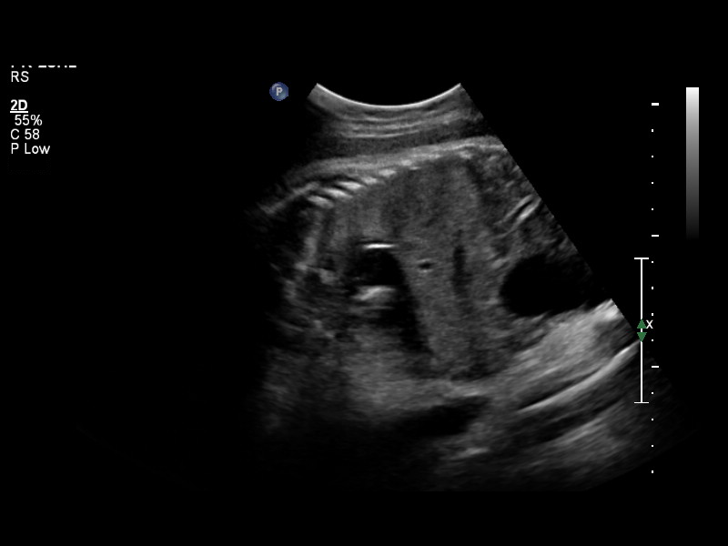
[im 9/13]
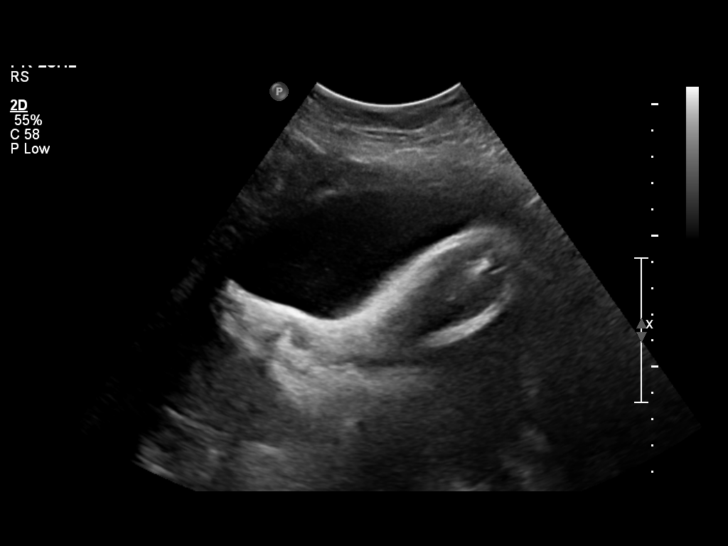
[im 10/13]
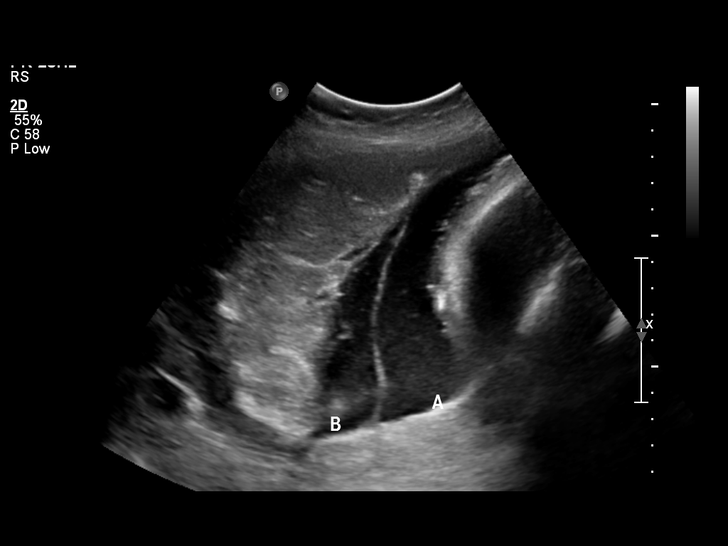
[im 11/13]
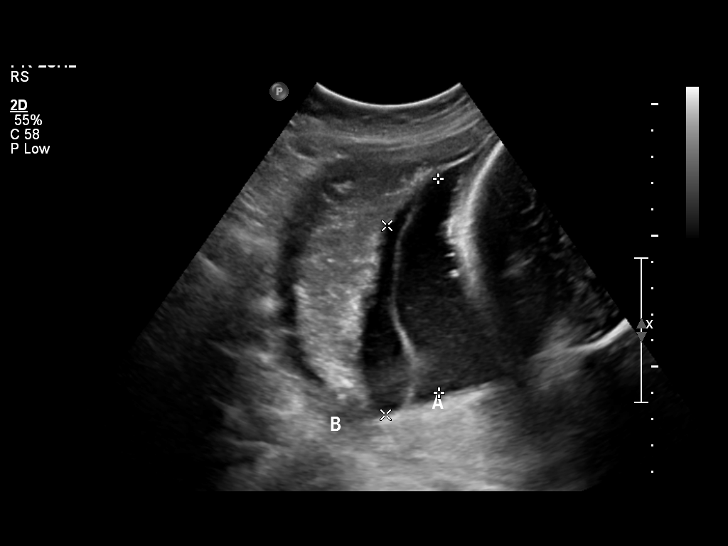
[im 12/13]
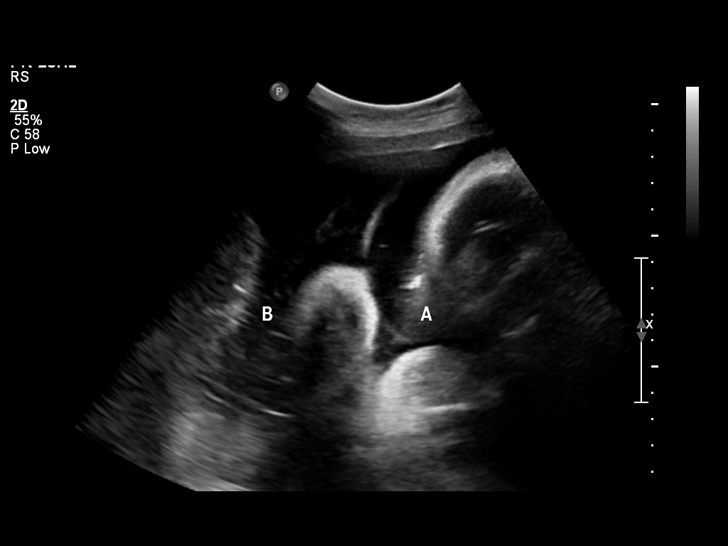
[im 13/13]
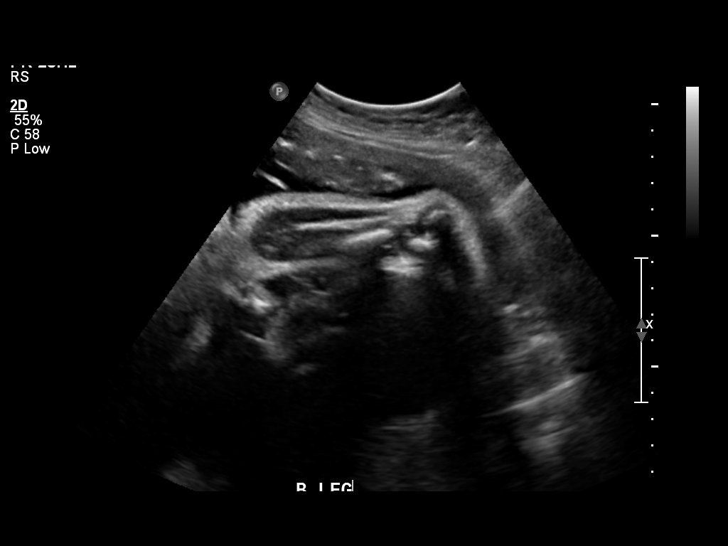

[13 of 13 positions shown; findings below may reference images not displayed]

IMPRESSION: See [facility]st[facility] US report.

## 2011-05-14 LAB — URINE MICROSCOPIC-ADD ON

## 2011-05-14 LAB — CBC
HCT: 35.7 — ABNORMAL LOW
HCT: 36.2
Hemoglobin: 12.5
MCV: 92
Platelets: 267
RDW: 11.4 — ABNORMAL LOW
RDW: 11.5
WBC: 5.5

## 2011-05-14 LAB — URINALYSIS, ROUTINE W REFLEX MICROSCOPIC
Glucose, UA: NEGATIVE
Protein, ur: 30 — AB
Specific Gravity, Urine: 1.014
pH: 6

## 2011-05-14 LAB — GC/CHLAMYDIA PROBE AMP, GENITAL
Chlamydia, DNA Probe: NEGATIVE
GC Probe Amp, Genital: NEGATIVE

## 2011-05-14 LAB — WET PREP, GENITAL
WBC, Wet Prep HPF POC: NONE SEEN
Yeast Wet Prep HPF POC: NONE SEEN

## 2011-05-14 LAB — DIFFERENTIAL
Basophils Absolute: 0
Eosinophils Relative: 2
Lymphocytes Relative: 36
Lymphs Abs: 2
Neutro Abs: 2.7
Neutrophils Relative %: 49

## 2011-05-14 LAB — ABO/RH: ABO/RH(D): O POS

## 2011-05-19 LAB — RAPID STREP SCREEN (MED CTR MEBANE ONLY): Streptococcus, Group A Screen (Direct): POSITIVE — AB

## 2012-01-08 ENCOUNTER — Encounter (HOSPITAL_COMMUNITY): Payer: Self-pay

## 2012-01-08 ENCOUNTER — Emergency Department (INDEPENDENT_AMBULATORY_CARE_PROVIDER_SITE_OTHER)
Admission: EM | Admit: 2012-01-08 | Discharge: 2012-01-08 | Disposition: A | Discharge status: Home / Self Care | Payer: Self-pay | Source: Home / Self Care | Attending: Emergency Medicine | Admitting: Emergency Medicine

## 2012-01-08 DIAGNOSIS — J029 Acute pharyngitis, unspecified: Secondary | ICD-10-CM

## 2012-01-08 DIAGNOSIS — J329 Chronic sinusitis, unspecified: Secondary | ICD-10-CM

## 2012-01-08 MED ORDER — ACETAMINOPHEN-CODEINE #3 300-30 MG PO TABS: 1.0000 | ORAL_TABLET | Freq: Four times a day (QID) | ORAL | Status: AC | PRN

## 2012-01-08 MED ORDER — FEXOFENADINE HCL 60 MG PO TABS: 60.0000 mg | ORAL_TABLET | Freq: Two times a day (BID) | ORAL | Status: DC

## 2012-01-08 NOTE — Discharge Instructions (Signed)
  As discussed increase your oral fluids and use a humidifier at night along with this medicines. If symptoms were to persist or worsen or lasts more than 10 days should return for recheck.    Sinusitis Sinuses are air pockets within the bones of your face. The growth of bacteria within a sinus leads to infection. The infection prevents the sinuses from draining. This infection is called sinusitis. SYMPTOMS  There will be different areas of pain depending on which sinuses have become infected.  The maxillary sinuses often produce pain beneath the eyes.   Frontal sinusitis may cause pain in the middle of the forehead and above the eyes.  Other problems (symptoms) include:  Toothaches.   Colored, pus-like (purulent) drainage from the nose.   Swelling, warmth, and tenderness over the sinus areas may be signs of infection.  TREATMENT  Sinusitis is most often determined by an exam.X-rays may be taken. If x-rays have been taken, make sure you obtain your results or find out how you are to obtain them. Your caregiver may give you medications (antibiotics). These are medications that will help kill the bacteria causing the infection. You may also be given a medication (decongestant) that helps to reduce sinus swelling.  HOME CARE INSTRUCTIONS   Only take over-the-counter or prescription medicines for pain, discomfort, or fever as directed by your caregiver.   Drink extra fluids. Fluids help thin the mucus so your sinuses can drain more easily.   Applying either moist heat or ice packs to the sinus areas may help relieve discomfort.   Use saline nasal sprays to help moisten your sinuses. The sprays can be found at your local drugstore.  SEEK IMMEDIATE MEDICAL CARE IF:  You have a fever.   You have increasing pain, severe headaches, or toothache.   You have nausea, vomiting, or drowsiness.   You develop unusual swelling around the face or trouble seeing.  MAKE SURE YOU:   Understand  these instructions.   Will watch your condition.   Will get help right away if you are not doing well or get worse.  Document Released: 08/10/2005 Document Revised: 07/30/2011 Document Reviewed: 03/09/2007 [facility] Patient Information 2012 [place], [place].

## 2012-01-08 NOTE — ED Notes (Signed)
Reports 5 day duration of pain facial area, body aches, green/yellow secretions; has not used any medicine for this problem

## 2012-01-08 NOTE — ED Provider Notes (Signed)
History     CSN: [id number]  Arrival date & time 01/08/12  1244   First MD Initiated Contact with Patient 01/08/12 1255      Chief Complaint  Patient presents with  . Facial Pain    (Consider location/radiation/quality/duration/timing/severity/associated sxs/prior treatment) HPI Comments: Patient with 5 days of ongoing sinus congestion body aches stuffed up nose. When she blows her nose she sees yellowish to green secretions. Also feeling tired body aches thinks he has had a low-grade fever yesterday. Patient denies any shortness of breath, wheezing, without pain or vomiting. Patient has not tried anything over-the-counter for her symptoms.  The history is provided by the patient.    History reviewed. No pertinent past medical history.  History reviewed. No pertinent past surgical history.  History reviewed. No pertinent family history.  History  Substance Use Topics  . Smoking status: Never Smoker   . Smokeless tobacco: Not on file  . Alcohol Use: Yes    OB History    Grav Para Term Preterm Abortions TAB SAB Ect Mult Living                  Review of Systems  Constitutional: Positive for fever, activity change, appetite change and fatigue. Negative for chills.  HENT: Positive for congestion, sore throat, postnasal drip and sinus pressure. Negative for ear pain, facial swelling, neck pain and ear discharge.   Respiratory: Negative for cough and shortness of breath.   Cardiovascular: Negative for chest pain.  Gastrointestinal: Negative for nausea, abdominal pain, diarrhea and constipation.  Skin: Negative for rash.    Allergies  Review of patient's allergies indicates no known allergies.  Home Medications   Current Outpatient Rx  Name Route Sig Dispense Refill  . ACETAMINOPHEN-CODEINE #3 300-30 MG PO TABS Oral Take 1-2 tablets by mouth every 6 (six) hours as needed for pain. 15 tablet 0  . FEXOFENADINE HCL 60 MG PO TABS Oral Take 1 tablet (60 mg total) by mouth  2 (two) times daily. 30 tablet 0    BP 115/79  Pulse 97  Temp(Src) 98.6 F (37 C) (Oral)  Resp 16  SpO2 97%  LMP 12/27/2011  Physical Exam  Nursing note and vitals reviewed. Constitutional: Vital signs are normal. She appears well-developed and well-nourished.  Non-toxic appearance. She does not have a sickly appearance. She does not appear ill.  HENT:  Head: Normocephalic.  Right Ear: Tympanic membrane normal.  Left Ear: Tympanic membrane normal.  Nose: Nose normal.  Mouth/Throat: Uvula is midline and mucous membranes are normal. Posterior oropharyngeal erythema present.  Eyes: Conjunctivae are normal. Right eye exhibits no discharge. No scleral icterus.  Neck: Neck supple. No JVD present.  Cardiovascular: Normal rate.  Exam reveals no gallop and no friction rub.   No murmur heard. Pulmonary/Chest: Effort normal. No respiratory distress. She has no decreased breath sounds. She has no wheezes. She has no rhonchi. She has no rales. She exhibits no tenderness.  Abdominal: She exhibits no mass. There is no tenderness. There is no rebound and no guarding.  Musculoskeletal: Normal range of motion.  Lymphadenopathy:    She has no cervical adenopathy.  Neurological: She is alert. No cranial nerve deficit. Coordination normal.  Skin: Skin is warm.    ED Course  Procedures (including critical care time)  Labs Reviewed - No data to display No results found.   1. Sinusitis   2. Pharyngitis       MDM   Afebrile with a relatively normal  exam besides mild pharyngeal erythema. Symptomatic treatment encouraged and advised to return if symptoms were to persist or worsen after 10-14 days. Patient agree with treatment plan and followup care as necessary.       Jimmie Molly, MD 01/08/12 [id number]

## 2014-03-12 ENCOUNTER — Encounter (HOSPITAL_COMMUNITY): Payer: Self-pay | Admitting: Emergency Medicine

## 2014-03-12 ENCOUNTER — Emergency Department (HOSPITAL_COMMUNITY)
Admission: EM | Admit: 2014-03-12 | Discharge: 2014-03-12 | Disposition: A | Discharge status: Home / Self Care | Payer: Self-pay | Attending: Emergency Medicine | Admitting: Emergency Medicine

## 2014-03-12 DIAGNOSIS — L03114 Cellulitis of left upper limb: Secondary | ICD-10-CM

## 2014-03-12 DIAGNOSIS — IMO0002 Reserved for concepts with insufficient information to code with codable children: Secondary | ICD-10-CM | POA: Insufficient documentation

## 2014-03-12 DIAGNOSIS — Z79899 Other long term (current) drug therapy: Secondary | ICD-10-CM | POA: Insufficient documentation

## 2014-03-12 MED ORDER — SULFAMETHOXAZOLE-TMP DS 800-160 MG PO TABS: 1.0000 | ORAL_TABLET | Freq: Two times a day (BID) | ORAL | Status: DC

## 2014-03-12 MED ORDER — CEPHALEXIN 500 MG PO CAPS: ORAL_CAPSULE | ORAL | Status: DC

## 2014-03-12 NOTE — ED Notes (Signed)
Pt c/o insect bite to left forearm x 2 days with some mild pain, redness and swelling noted

## 2014-03-12 NOTE — Discharge Instructions (Signed)
Keep area clean and dry. Apply warm compresses to affected area throughout the day. Take antibiotic until it is finished. Followup with [facility] doctor in 7 days for wound recheck if this area has not improved. Take over the counter benadryl for itching.  Return to emergency department for emergent changing or worsening symptoms.   Cellulitis Cellulitis is an infection of the skin and the tissue under the skin. The infected area is usually red and tender. This happens most often in the arms and lower legs. HOME CARE   Take your antibiotic medicine as told. Finish the medicine even if you start to feel better.  Keep the infected arm or leg raised (elevated).  Put a warm cloth on the area up to 4 times per day.  Only take medicines as told by your doctor.  Keep all doctor visits as told. GET HELP RIGHT AWAY IF:   You have a fever.  You feel very sleepy.  You throw up (vomit) or have watery poop (diarrhea).  You feel sick and have muscle aches and pains.  You see red streaks on the skin coming from the infected area.  Your red area gets bigger or turns a dark color.  Your bone or joint under the infected area is painful after the skin heals.  Your infection comes back in the same area or different area.  You have a puffy (swollen) bump in the infected area.  You have new symptoms. MAKE SURE YOU:   Understand these instructions.  Will watch your condition.  Will get help right away if you are not doing well or get worse. Document Released: 01/27/2008 Document Revised: 02/09/2012 Document Reviewed: 10/26/2011 [facility] Patient Information 2015 [place], [place]. This information is not intended to replace advice given to you by your health care provider. Make sure you discuss any questions you have with your health care provider.  Emergency Department Resource Guide 1) Find a Doctor and Pay Out of Pocket Although you won't have to find out who is  covered by your insurance plan, it is a good idea to ask around and get recommendations. You will then need to call the office and see if the doctor you have chosen will accept you as a new patient and what types of options they offer for patients who are self-pay. Some doctors offer discounts or will set up payment plans for their patients who do not have insurance, but you will need to ask so you aren't surprised when you get to your appointment.  2) Contact Your Local Health Department Not all health departments have doctors that can see patients for sick visits, but many do, so it is worth a call to see if yours does. If you don't know where your local health department is, you can check in your phone book. The CDC also has a tool to help you locate your state's health department, and many state websites also have listings of all of their local health departments.  3) Find a Walk-in Clinic If your illness is not likely to be very severe or complicated, you may want to try a walk in clinic. These are popping up all over the country in pharmacies, drugstores, and shopping centers. They're usually staffed by nurse practitioners or physician assistants that have been trained to treat common illnesses and complaints. They're usually fairly quick and inexpensive. However, if you have serious medical issues or chronic medical problems, these are probably not your best option.  No  Primary Care Doctor: - Call Health Connect at  [phone] - they can help you locate a primary care doctor that  accepts your insurance, provides certain services, etc. - Physician Referral Service- [phone]  Chronic Pain Problems: Organization         Address  Phone   Notes  [facility]  [phone] Patients need to be referred by their primary care doctor.   Medication Assistance: Organization         Address  Phone   Notes  [facility] Medication [facility] [address] [place], [place] [address] [phone] --Must be a resident of [facility] -- Must have NO insurance coverage whatsoever (no Medicaid/ Medicare, etc.) -- The pt. MUST have a primary care doctor that directs their care regularly and follows them in the community   [facility]  [phone]   [employer]  [phone]    Agencies that provide inexpensive medical care: Organization         Address  Phone   Notes  [facility]  [phone]   [facility]    [phone]   [facility] [address] [place], [place] [address] [phone]   [facility] [address] [place], [place] [phone]   Planned Parenthood    [phone]   [facility]    [phone]   [facility]  [address], [place] Phone:  [phone], Fax:  [phone] Hours of Operation:  9 am - 6 pm, M-F.  Also accepts Medicaid/Medicare and self-pay.  [facility]  [address], [place] Phone: [phone], Fax: [phone]. Hours of Operation:  8:30 am - 5:30 pm, M-F.  Also accepts Medicaid and self-pay.  [facility] [place] [address], [place] Phone: [phone]   [facility] [address] Natasha Bence [place], [place] [phone], Ext. [phone] Mondays & Thursdays: 7-9 AM.  First 15 patients are seen on a first come, first serve basis.    Medicaid-accepting [facility] Providers:  Organization         Address  Phone   Notes  [facility] [address],  [phone] Also accepts self-pay patients.  [facility] [address] Laurell Josephs [place], [place]  [phone]   [facility] [address], [place] [phone]   [facility] [address], [place] [phone]   Renaye Rakers [address], [place]   [phone] Only accepts [place] patients after they have their name applied to their card.   Self-Pay (no insurance) in [facility]:  Organization         Address  Phone   Notes  Sickle Cell Patients, [facility] [address] [place], [place] [phone]   [facility] [address] [place], [place] [phone]   [facility]  [address] [place] [address],  [phone]   [facility]/Dr. Osei-Bonsu  [address] [place] or [address], [place] [phone] Phone number for both [place] and [place] locations is the same.  [facility] [address] Ginette Otto [phone]  [facility] [address] [place] or [address] [phone]   [facility] [address] [place], [place] [phone], phone; [phone], fax Sees patients 1st and 3rd Saturday of every month.  Must not qualify for public or private insurance (i.e. Medicaid, Medicare, [place] Health Choice, Veterans' Benefits)  Household income should be no more than 200% of the poverty level The clinic cannot treat you if you are pregnant or think you are pregnant  Sexually transmitted diseases are not treated at the clinic.    Dental Care: Organization         Address  Phone  Notes  [facility] Department of [facility] [address] [place], [place] [phone] Accepts children up to age 66 who are enrolled in [place] or [place] Health Choice; pregnant women with a Medicaid card; and children who have applied for Medicaid or [place] Health Choice, but were declined, whose parents can pay a reduced fee at time of service.  [facility] Department of [facility]  [address], [place] [phone] Accepts children up to age 103 who are enrolled  in [place] or [place] Health Choice; pregnant women with a Medicaid card; and children who have applied for Medicaid or [place] Health Choice, but were declined, whose parents can pay a reduced fee at time of service.  [facility]  [address] [place], [place] [phone] Patients are seen by appointment only. Walk-ins are not accepted. [facility] will see patients 68 years of age and older. Monday - Tuesday (8am-5pm) Most Wednesdays (8:30-5pm) $30 per visit, cash only  [facility]  [address], [facility] [phone] Patients are seen by appointment only. Walk-ins are not accepted. [facility] will see patients 47 years of age and older. One Wednesday Evening (Monthly: Volunteer Based).  $30 per visit, cash only  [employer]  [phone] for adults; Children under age 60, call Graduate Pediatric Dentistry at [phone]. Children aged 33-14, please call [phone] to request a pediatric application.  Dental services are provided in all areas of dental care including fillings, crowns and bridges, complete and partial dentures, implants, gum treatment, root canals, and extractions. Preventive care is also provided. Treatment is provided to both adults and children. Patients are selected via a lottery and there is often a waiting list.   [facility] [address], [place]  [phone] [email or web address]   [facility] [address] [place], [place] [phone], Ext. [phone] Second and Fourth Thursday of each month, opens at 6:30 AM; Clinic ends at 9 AM.  Patients are seen on a first-come first-served basis, and a limited number are seen during each clinic.   [facility]  [address] Ether Griffins [place], [place] [phone]   Eligibility Requirements You must have lived in [place], [place], or [place] counties for at least the last three months.   You cannot be  eligible for state or federal sponsored [place], including [employer], [place], or [employer].   You generally cannot be eligible for healthcare insurance through your employer.    How to apply: Eligibility screenings are held every Tuesday and Wednesday afternoon from 1:00 pm until 4:00 pm. You do not need an appointment for the interview!  [facility] [address], [place], [place] [phone]  [facility]  [phone]   [facility]  [phone]   [facility]  [phone]    Behavioral Health Resources in the Community: Intensive Outpatient Programs Organization         Address  Phone  Notes  [facility] [address], [place], [place] [phone]   [facility] [address], [place], [place] [phone]   [facility] [address], [place], [place]  [phone]   [facility] [address],  [place], [place] [phone] or [phone]   Substance Abuse Resources Organization         Address  Phone  Notes  [facility]  [phone]   [facility]  [phone]   [facility]  [phone]   [facility]  [phone]   Residential & Outpatient Substance Abuse Program  [phone]   Psychological Services Organization         Address  Phone  Notes  [facility]  [phone]- [phone]   [facility]  [phone]- [phone]   [facility] [address], [place] [phone] or [phone]    Mobile Crisis Teams Organization         Address  Phone  Notes  [facility], Mobile Crisis Care Unit  [phone]   [facility]  [address] [place], [place] [phone]   Doristine LocksSharon DeEsch [address] [place] [phone]    Self-Help/Support Groups Organization          Address  Phone             Notes  [facility] [place] - variety of support groups  [phone] Call for more information  Narcotics Anonymous (NA), [facility] [address], [place]  2 meetings at this location   Statisticianesidential Treatment Programs Organization         Address  Phone  Notes  [facility] [address],    [place]  [phone]   [facility]  [address], [place] [phone], [place], [place] [phone]   [facility] [address], [place] [phone] Admissions: 8am-3pm M-F  [facility] [address]    [place], [place] [phone]   [facility] [address], [place], [place] [phone]   [facility] [address]  [place], [place] [phone]   [facility] - Intensive Outpatient [address], [place], [place] [phone]   [facility] ([facility] [address]  [place], [place] [phone] or [phone]   [facility] ([facility]) [address] [place], [place] [phone] Accepts Medicaid  [facility] [address]  [place] [phone] Substance Abuse/Addiction Treatment   [facility] Resources Organization         Address  Phone  Notes  [facility]  [phone]   Angie FavaJulie Brannon, PhD [address], [address] [place], [place]   [phone]) [phone] or [phone]   [facility]   [address] [place], [place] [phone]   [facility] [address], [place], [place] [phone] Insurance/Medicaid/sponsorship through [employer] [facility]  [facility] [address]  [place], [place] [phone] [place], [place] [phone]    Dr. Adele Schilder  [phone]   [facility] Dept. 1) [address], [place] 2) [place] 3)  [address], [place] [phone]  [phone]   [place] [phone] or [phone] (After Hours)

## 2014-03-12 NOTE — ED Provider Notes (Signed)
CSN: [id number]     Arrival date & time 03/12/14  1809 History  This chart was scribed for non-physician practitioner, Allen Derry, PA-C working with Dagmar Hait, MD by Luisa Dago, ED scribe. This patient was seen in room TR08C/TR08C and the patient's care was started at 7:31 PM.     Chief Complaint  Patient presents with  . Insect Bite   Patient is a 33 y.o. female presenting with animal bite. The history is provided by the patient. No language interpreter was used.  Animal Bite Contact animal:  Insect Location:  Shoulder/arm Shoulder/arm injury location:  L forearm Time since incident:  2 days Pain details:    Severity:  Mild   Progression:  Unchanged Incident location:  Home Notifications:  None Relieved by:  Nothing Worsened by:  Nothing tried Ineffective treatments:  None tried Associated symptoms: rash (insect bite)   Associated symptoms: no fever and no numbness    HPI Comments: Natalie Orr is a 33 y.o. female with no PMHx who presents to the Emergency Department complaining of a possible insect bite that occurred approximately 2 days ago. Pt is complaining of an associated bump to her left forearm. She is also complaining of associated redness, swelling, and mild pain. Pt states that it started out as a mosquito bite, however upon waking today she noticed some mild pain around the area and itching. She denies using any OTC medication or applying warm compresses. Denies any warmth to the area, drainage, red streaking, fevers/chills, abd pain, nausea, emesis, paraesthesia, SOB, chest pain, congestion, or numbness.   History reviewed. No pertinent past medical history. History reviewed. No pertinent past surgical history. History reviewed. No pertinent family history. History  Substance Use Topics  . Smoking status: Never Smoker   . Smokeless tobacco: Not on file  . Alcohol Use: Yes   OB History   Grav Para Term Preterm Abortions TAB SAB Ect  Mult Living                 Review of Systems  Constitutional: Negative for fever and chills.  Respiratory: Negative for shortness of breath and wheezing.   Gastrointestinal: Negative for nausea and vomiting.  Skin: Positive for rash (insect bite).  Neurological: Negative for numbness.      Allergies  Review of patient's allergies indicates no known allergies.  Home Medications   Prior to Admission medications   Medication Sig Start Date End Date Taking? Authorizing Provider  cephALEXin (KEFLEX) 500 MG capsule 2 caps po bid x 7 days 03/12/14   Anirudh Baiz Strupp Camprubi-Soms, PA-C  fexofenadine (ALLEGRA) 60 MG tablet Take 1 tablet (60 mg total) by mouth 2 (two) times daily. 01/08/12 01/07/13  Jimmie Molly, MD  sulfamethoxazole-trimethoprim (BACTRIM DS) 800-160 MG per tablet Take 1 tablet by mouth 2 (two) times daily. 03/12/14   Ginnifer Creelman Strupp Camprubi-Soms, PA-C   BP 122/84  Pulse 78  Temp(Src) 98.5 F (36.9 C) (Oral)  Resp 18  Ht 5' (1.524 m)  Wt 180 lb (81.647 kg)  BMI 35.15 kg/m2  SpO2 100%  Physical Exam  Nursing note and vitals reviewed. Constitutional: She is oriented to person, place, and time. Vital signs are normal. She appears well-developed and well-nourished. No distress.  HENT:  Head: Normocephalic and atraumatic.  Mouth/Throat: Mucous membranes are normal.  Eyes: Conjunctivae and EOM are normal.  Neck: Normal range of motion. Neck supple.  Cardiovascular: Normal rate and intact distal pulses.   Intact distal pulses. Capillary refill less  than 2 seconds.  Pulmonary/Chest: Effort normal. No respiratory distress.  Musculoskeletal: Normal range of motion.  Left elbow and wrist range of motion intact. No bony tenderness or deformity. Capillary refill intact. Strength 5/5 in bilateral upper extremities. sensation grossly intact. Small insect bite located over the lateral forearm as described below  Neurological: She is alert and oriented to person, place, and time.  She has normal strength. No sensory deficit.  Strength 5/5 in bilateral upper extremities.sensation grossly intact.  Skin: Skin is warm and dry. There is erythema.     5 cm area of erythema over her left forearm, mildly swollen. No induration or fluctuance. Mild tenderness to palpation, mild warmth. No drainage or red streaking.  Psychiatric: She has a normal mood and affect. Her behavior is normal.    ED Course  Procedures (including critical care time)  DIAGNOSTIC STUDIES: Oxygen Saturation is 100% on RA, normal by my interpretation.    COORDINATION OF CARE: 7:34 PM- Advised pt to return if she notices any signs of infections. Told pt to apply warm compresses to the area. Pt advised of plan for treatment and pt agrees.  Labs Review Labs Reviewed - No data to display  Imaging Review No results found.   EKG Interpretation None      MDM   Final diagnoses:  Cellulitis of left upper extremity    Natalie Orr is a 33 y.o. female with no PMHx, presenting today with an insect bite over the left forearm. Patient is afebrile, nontoxic appearing. This appears to be early cellulitis, patient has a history of abscesses in the past but no history of diabetes. Will give patient Bactrim and Keflex for 7 days. I explained the diagnosis and have given explicit precautions to return to the ER including for any other new or worsening symptoms. The patient understands and accepts the medical plan as it's been dictated and I have answered their questions. Discharge instructions concerning home care and prescriptions have been given. The patient is STABLE and is discharged to home in good condition.  I personally performed the services described in this documentation, which was scribed in my presence. The recorded information has been reviewed and is accurate.  BP 115/66  Pulse 77  Temp(Src) 98.6 F (37 C) (Oral)  Resp 16  Ht 5' (1.524 m)  Wt 180 lb (81.647 kg)  BMI 35.15 kg/m2  SpO2  97%   [employer]cedes Strupp Camprubi-Soms, PA-C 03/13/14 [phone]

## 2014-03-13 NOTE — ED Provider Notes (Signed)
Medical screening examination/treatment/procedure(s) were performed by non-physician practitioner and as supervising physician I was immediately available for consultation/collaboration.   EKG Interpretation None        William Annis Lagoy, MD 03/13/14 2247 

## 2014-06-15 ENCOUNTER — Encounter (HOSPITAL_COMMUNITY): Payer: Self-pay | Admitting: Emergency Medicine

## 2014-06-15 ENCOUNTER — Emergency Department (HOSPITAL_COMMUNITY)
Admission: EM | Admit: 2014-06-15 | Discharge: 2014-06-15 | Disposition: A | Discharge status: Home / Self Care | Payer: Self-pay | Attending: Emergency Medicine | Admitting: Emergency Medicine

## 2014-06-15 DIAGNOSIS — H9209 Otalgia, unspecified ear: Secondary | ICD-10-CM | POA: Insufficient documentation

## 2014-06-15 DIAGNOSIS — M791 Myalgia: Secondary | ICD-10-CM | POA: Insufficient documentation

## 2014-06-15 DIAGNOSIS — R6889 Other general symptoms and signs: Secondary | ICD-10-CM

## 2014-06-15 DIAGNOSIS — R10819 Abdominal tenderness, unspecified site: Secondary | ICD-10-CM

## 2014-06-15 DIAGNOSIS — J3489 Other specified disorders of nose and nasal sinuses: Secondary | ICD-10-CM | POA: Insufficient documentation

## 2014-06-15 DIAGNOSIS — J029 Acute pharyngitis, unspecified: Secondary | ICD-10-CM | POA: Insufficient documentation

## 2014-06-15 DIAGNOSIS — R05 Cough: Secondary | ICD-10-CM | POA: Insufficient documentation

## 2014-06-15 DIAGNOSIS — Z79899 Other long term (current) drug therapy: Secondary | ICD-10-CM | POA: Insufficient documentation

## 2014-06-15 DIAGNOSIS — Z7952 Long term (current) use of systemic steroids: Secondary | ICD-10-CM | POA: Insufficient documentation

## 2014-06-15 DIAGNOSIS — R110 Nausea: Secondary | ICD-10-CM | POA: Insufficient documentation

## 2014-06-15 DIAGNOSIS — R0981 Nasal congestion: Secondary | ICD-10-CM | POA: Insufficient documentation

## 2014-06-15 DIAGNOSIS — R197 Diarrhea, unspecified: Secondary | ICD-10-CM | POA: Insufficient documentation

## 2014-06-15 DIAGNOSIS — R5383 Other fatigue: Secondary | ICD-10-CM | POA: Insufficient documentation

## 2014-06-15 DIAGNOSIS — R509 Fever, unspecified: Secondary | ICD-10-CM | POA: Insufficient documentation

## 2014-06-15 DIAGNOSIS — R1032 Left lower quadrant pain: Secondary | ICD-10-CM | POA: Insufficient documentation

## 2014-06-15 DIAGNOSIS — D649 Anemia, unspecified: Secondary | ICD-10-CM | POA: Insufficient documentation

## 2014-06-15 DIAGNOSIS — Z3202 Encounter for pregnancy test, result negative: Secondary | ICD-10-CM | POA: Insufficient documentation

## 2014-06-15 LAB — URINALYSIS, ROUTINE W REFLEX MICROSCOPIC
BILIRUBIN URINE: NEGATIVE
Glucose, UA: NEGATIVE mg/dL
HGB URINE DIPSTICK: NEGATIVE
Ketones, ur: NEGATIVE mg/dL
Leukocytes, UA: NEGATIVE
NITRITE: NEGATIVE
PH: 7.5 (ref 5.0–8.0)
Protein, ur: NEGATIVE mg/dL
SPECIFIC GRAVITY, URINE: 1.013 (ref 1.005–1.030)
Urobilinogen, UA: 1 mg/dL (ref 0.0–1.0)

## 2014-06-15 LAB — CBC WITH DIFFERENTIAL/PLATELET
BASOS ABS: 0 10*3/uL (ref 0.0–0.1)
BASOS PCT: 0 % (ref 0–1)
EOS ABS: 0 10*3/uL (ref 0.0–0.7)
EOS PCT: 0 % (ref 0–5)
HCT: 29.5 % — ABNORMAL LOW (ref 36.0–46.0)
Hemoglobin: 9.7 g/dL — ABNORMAL LOW (ref 12.0–15.0)
LYMPHS PCT: 8 % — AB (ref 12–46)
Lymphs Abs: 0.6 10*3/uL — ABNORMAL LOW (ref 0.7–4.0)
MCH: 25.5 pg — ABNORMAL LOW (ref 26.0–34.0)
MCHC: 32.9 g/dL (ref 30.0–36.0)
MCV: 77.4 fL — AB (ref 78.0–100.0)
MONO ABS: 0.6 10*3/uL (ref 0.1–1.0)
Monocytes Relative: 8 % (ref 3–12)
Neutro Abs: 6.1 10*3/uL (ref 1.7–7.7)
Neutrophils Relative %: 84 % — ABNORMAL HIGH (ref 43–77)
PLATELETS: 340 10*3/uL (ref 150–400)
RBC: 3.81 MIL/uL — ABNORMAL LOW (ref 3.87–5.11)
RDW: 14.9 % (ref 11.5–15.5)
WBC: 7.3 10*3/uL (ref 4.0–10.5)

## 2014-06-15 LAB — PREGNANCY, URINE: PREG TEST UR: NEGATIVE

## 2014-06-15 LAB — BASIC METABOLIC PANEL
ANION GAP: 13 (ref 5–15)
BUN: 9 mg/dL (ref 6–23)
CALCIUM: 9.3 mg/dL (ref 8.4–10.5)
CO2: 25 meq/L (ref 19–32)
CREATININE: 0.86 mg/dL (ref 0.50–1.10)
Chloride: 102 mEq/L (ref 96–112)
GFR, EST NON AFRICAN AMERICAN: 88 mL/min — AB (ref >90)
Glucose, Bld: 88 mg/dL (ref 70–99)
Potassium: 3.8 mEq/L (ref 3.7–5.3)
SODIUM: 140 meq/L (ref 137–147)

## 2014-06-15 LAB — RAPID STREP SCREEN (MED CTR MEBANE ONLY): STREPTOCOCCUS, GROUP A SCREEN (DIRECT): NEGATIVE

## 2014-06-15 LAB — LIPASE, BLOOD: LIPASE: 22 U/L (ref 11–59)

## 2014-06-15 MED ORDER — ACETAMINOPHEN 325 MG PO TABS
650.0000 mg | ORAL_TABLET | Freq: Four times a day (QID) | ORAL | Status: DC | PRN
  Administered 2014-06-15: 650 mg via ORAL
  Filled 2014-06-15: qty 2

## 2014-06-15 MED ORDER — SODIUM CHLORIDE 0.9 % IV BOLUS (SEPSIS)
1000.0000 mL | Freq: Once | INTRAVENOUS | Status: AC
  Administered 2014-06-15: 1000 mL via INTRAVENOUS

## 2014-06-15 MED ORDER — ONDANSETRON HCL 4 MG PO TABS: 4.0000 mg | ORAL_TABLET | Freq: Four times a day (QID) | ORAL | Status: DC

## 2014-06-15 MED ORDER — FLUTICASONE PROPIONATE 50 MCG/ACT NA SUSP: 1.0000 | Freq: Every day | NASAL | Status: DC

## 2014-06-15 MED ORDER — ONDANSETRON 4 MG PO TBDP
4.0000 mg | ORAL_TABLET | Freq: Once | ORAL | Status: AC
  Administered 2014-06-15: 4 mg via ORAL
  Filled 2014-06-15: qty 1

## 2014-06-15 MED ORDER — IBUPROFEN 800 MG PO TABS
800.0000 mg | ORAL_TABLET | Freq: Once | ORAL | Status: AC
  Administered 2014-06-15: 800 mg via ORAL
  Filled 2014-06-15: qty 1

## 2014-06-15 NOTE — ED Notes (Signed)
Pt in c/o cough and sore throat, also lower abd pain and nausea, fever at home, symptoms all started today, reports generalized fatigue and body aches

## 2014-06-15 NOTE — Discharge Instructions (Signed)
Return to the emergency room with worsening of symptoms, new symptoms or with symptoms that are concerning, especially worsening fevers, stiff neck, worsening headache, nausea/vomiting, visual changes or slurred speech, chest pain, shortness of breath, cough with thick colored mucous or blood. BRAT diet: bananas, rice, applesauce, toast. As well as soups. Get plenty of rest. Drink plenty of fluids with electrolytes especially Gatorade. OTC cold medications such as mucinex, nyquil, dayquil are recommended. Chloraseptic for sore throat.  Ibuprofen and tylenol for aches and pains.

## 2014-06-15 NOTE — ED Provider Notes (Signed)
CSN: [id number]     Arrival date & time 06/15/14  1657 History   First MD Initiated Contact with Patient 06/15/14 1745     Chief Complaint  Patient presents with  . URI  . Abdominal Pain     (Consider location/radiation/quality/duration/timing/severity/associated sxs/prior Treatment) HPI Natalie Orr is a 33 y.o. female presenting with one day history of fever, dry cough, sore throat, rhinorrhea and congestion, generalized fatigue and myalgia. Patient with lower abdominal pain that started this morning, associated with nausea but not emesis. Last BM yesterday which was loose, nonbloody. No melena, hematochezia. No urinary or vaginal complaints. LMP one week ago. Patient denies any sick contacts.    History reviewed. No pertinent past medical history. History reviewed. No pertinent past surgical history. History reviewed. No pertinent family history. History  Substance Use Topics  . Smoking status: Never Smoker   . Smokeless tobacco: Not on file  . Alcohol Use: Yes   OB History   Grav Para Term Preterm Abortions TAB SAB Ect Mult Living                 Review of Systems  Constitutional: Positive for fever and chills.  HENT: Positive for congestion, ear pain and rhinorrhea.   Eyes: Negative for visual disturbance.  Respiratory: Negative for cough and shortness of breath.   Cardiovascular: Negative for chest pain and palpitations.  Gastrointestinal: Positive for nausea, abdominal pain and diarrhea. Negative for vomiting.  Genitourinary: Negative for dysuria and hematuria.  Musculoskeletal: Negative for back pain and gait problem.  Skin: Negative for rash.  Neurological: Negative for weakness and headaches.      Allergies  Review of patient's allergies indicates no known allergies.  Home Medications   Prior to Admission medications   Medication Sig Start Date End Date Taking? Authorizing Provider  ibuprofen (ADVIL,MOTRIN) 200 MG tablet Take 200 mg by mouth every  6 (six) hours as needed for fever or mild pain.   Yes Historical Provider, MD  fluticasone (FLONASE) 50 MCG/ACT nasal spray Place 1 spray into both nostrils daily. 06/15/14   Louann SjogrenVictoria L Itzayana Pardy, PA-C  ondansetron (ZOFRAN) 4 MG tablet Take 1 tablet (4 mg total) by mouth every 6 (six) hours. 06/15/14   Benetta SparVictoria L Nakima Fluegge, PA-C   BP 100/60  Pulse 96  Temp(Src) 99.4 F (37.4 C) (Oral)  Resp 18  Ht 5' (1.524 m)  Wt 180 lb (81.647 kg)  BMI 35.15 kg/m2  SpO2 98%  LMP 06/10/2014 Physical Exam  Nursing note and vitals reviewed. Constitutional: She appears well-developed and well-nourished. No distress.  HENT:  Head: Normocephalic and atraumatic.  Right Ear: Tympanic membrane normal.  Left Ear: Tympanic membrane normal.  Nose: Right sinus exhibits no maxillary sinus tenderness and no frontal sinus tenderness. Left sinus exhibits no maxillary sinus tenderness and no frontal sinus tenderness.  Mouth/Throat: Mucous membranes are normal. Posterior oropharyngeal edema and posterior oropharyngeal erythema present. No oropharyngeal exudate.  Eyes: Conjunctivae and EOM are normal. Right eye exhibits no discharge. Left eye exhibits no discharge.  Neck: Normal range of motion. Neck supple.  Cardiovascular: Normal rate, regular rhythm and normal heart sounds.   Pulmonary/Chest: Effort normal and breath sounds normal. No respiratory distress. She has no wheezes. She has no rales.  Abdominal: Soft. Bowel sounds are normal. She exhibits no distension.  Mild mid line and left sided lower abdominal pain without rebound, guarding or rigidity.   Lymphadenopathy:    She has no cervical adenopathy.  Neurological: She is  alert.  Skin: Skin is warm and dry. She is not diaphoretic.    ED Course  Procedures (including critical care time) Labs Review Labs Reviewed  URINALYSIS, ROUTINE W REFLEX MICROSCOPIC - Abnormal; Notable for the following:    APPearance HAZY (*)    All other components within normal limits   CBC WITH DIFFERENTIAL - Abnormal; Notable for the following:    RBC 3.81 (*)    Hemoglobin 9.7 (*)    HCT 29.5 (*)    MCV 77.4 (*)    MCH 25.5 (*)    Neutrophils Relative % 84 (*)    Lymphocytes Relative 8 (*)    Lymphs Abs 0.6 (*)    All other components within normal limits  BASIC METABOLIC PANEL - Abnormal; Notable for the following:    GFR calc non Af Amer 88 (*)    All other components within normal limits  RAPID STREP SCREEN  CULTURE, GROUP A STREP  PREGNANCY, URINE  LIPASE, BLOOD    Imaging Review No results found.   EKG Interpretation None     Meds given in ED:  Medications  acetaminophen (TYLENOL) tablet 650 mg (650 mg Oral Given 06/15/14 1711)  sodium chloride 0.9 % bolus 1,000 mL (0 mLs Intravenous Stopped 06/15/14 1944)  ibuprofen (ADVIL,MOTRIN) tablet 800 mg (800 mg Oral Given 06/15/14 1842)  ondansetron (ZOFRAN-ODT) disintegrating tablet 4 mg (4 mg Oral Given 06/15/14 1841)    Discharge Medication List as of 06/15/2014  7:41 PM    START taking these medications   Details  fluticasone (FLONASE) 50 MCG/ACT nasal spray Place 1 spray into both nostrils daily., Starting 06/15/2014, Until Discontinued, Print    ondansetron (ZOFRAN) 4 MG tablet Take 1 tablet (4 mg total) by mouth every 6 (six) hours., Starting 06/15/2014, Until Discontinued, Print          MDM   Final diagnoses:  Flu-like symptoms  Lower abdominal tenderness  Mild anemia   Patient with symptoms consistent with influenza.  Vitals are stable, low-grade fever.  No signs of dehydration, tolerating PO's.  Lungs are clear. Due to patient's presentation and physical exam a chest x-ray was not ordered bc likely diagnosis of flu.  Patient also with mild abdominal tenderness on exam. Labs reassuring. Patient much better after ibuprofen and fluids. Rapid strep negative. Discussed the cost versus benefit of Tamiflu treatment with the patient.  Patient without immunocompromise.  Patient will be  discharged with instructions to orally hydrate, rest, and use over-the-counter medications such as anti-inflammatories ibuprofen for muscle aches and Tylenol for fever.  Patient will also be given zofran and flonase.   Discussed return precautions with patient. Discussed all results and patient verbalizes understanding and agrees with plan.  Case has been discussed with Dr. Judd Lienelo who agrees with the above plan and to discharge.       Louann SjogrenVictoria L Cade Dashner, PA-C 06/15/14 2006

## 2014-06-16 NOTE — ED Provider Notes (Signed)
Medical screening examination/treatment/procedure(s) were performed by non-physician practitioner and as supervising physician I was immediately available for consultation/collaboration.     Stefanny Pieri, MD 06/16/14 0754 

## 2014-06-17 LAB — CULTURE, GROUP A STREP

## 2014-06-18 ENCOUNTER — Encounter (HOSPITAL_COMMUNITY): Payer: Self-pay | Admitting: Emergency Medicine

## 2014-06-18 ENCOUNTER — Emergency Department (HOSPITAL_COMMUNITY)
Admission: EM | Admit: 2014-06-18 | Discharge: 2014-06-18 | Disposition: A | Discharge status: Home / Self Care | Payer: Self-pay | Attending: Emergency Medicine | Admitting: Emergency Medicine

## 2014-06-18 DIAGNOSIS — Z7951 Long term (current) use of inhaled steroids: Secondary | ICD-10-CM | POA: Insufficient documentation

## 2014-06-18 DIAGNOSIS — J029 Acute pharyngitis, unspecified: Secondary | ICD-10-CM

## 2014-06-18 DIAGNOSIS — R51 Headache: Secondary | ICD-10-CM | POA: Insufficient documentation

## 2014-06-18 DIAGNOSIS — J069 Acute upper respiratory infection, unspecified: Secondary | ICD-10-CM | POA: Insufficient documentation

## 2014-06-18 DIAGNOSIS — Z79899 Other long term (current) drug therapy: Secondary | ICD-10-CM | POA: Insufficient documentation

## 2014-06-18 DIAGNOSIS — H9209 Otalgia, unspecified ear: Secondary | ICD-10-CM | POA: Insufficient documentation

## 2014-06-18 LAB — RAPID STREP SCREEN (MED CTR MEBANE ONLY): Streptococcus, Group A Screen (Direct): NEGATIVE

## 2014-06-18 MED ORDER — GUAIFENESIN ER 1200 MG PO TB12: 1.0000 | ORAL_TABLET | Freq: Two times a day (BID) | ORAL | Status: DC

## 2014-06-18 MED ORDER — ACETAMINOPHEN-CODEINE 120-12 MG/5ML PO SOLN: 10.0000 mL | ORAL | Status: DC | PRN

## 2014-06-18 MED ORDER — PREDNISONE 50 MG PO TABS: 50.0000 mg | ORAL_TABLET | Freq: Every day | ORAL | Status: DC

## 2014-06-18 NOTE — Discharge Instructions (Signed)
Return here as needed. Follow up with a primary doctor or urgent care. Increase your fluid intake.

## 2014-06-18 NOTE — ED Notes (Signed)
Pt c/o sore throat x a few days.  States that [facility] and "they didn't do nothing about it".

## 2014-06-18 NOTE — ED Provider Notes (Signed)
CSN: [id number]     Arrival date & time 06/18/14  1429 History  This chart was scribed for a non-physician practitioner, Carlyle Dollyhristopher W Zoeann Mol, PA-C, working with Raeford RazorStephen Kohut, MD by Julian HyMorgan Graham, ED Scribe. The patient was seen in WTR9/WTR9. The patient's care was started at 3:37 PM.   Chief Complaint  Patient presents with  . Sore Throat  . Fever    Patient is a 33 y.o. female presenting with fever. The history is provided by the patient. No language interpreter was used.  Fever Associated symptoms: congestion, ear pain, headaches, myalgias, rhinorrhea and sore throat   Associated symptoms: no chills, no nausea and no vomiting    HPI Comments: Natalie Orr is a 33 y.o. female who presents to the Emergency Department complaining of new, constant, moderate and gradually worsening sore throat onset 4-5 days ago. Pt has associated otalgia, fever, congestion, rhinorrhea, headaches, and myalgias. She notes her symptoms started with her sore throat. She attempted to take ibuprofen to alleviate her symptoms with mild relief. Pt notes her myalgias is now resolved. Pt states her [facility] and was told she has flu-like symptoms. Pt was [facility] but denies she received any medications. Pt denies any other symptoms at this time.   History reviewed. No pertinent past medical history. No past surgical history on file. No family history on file. History  Substance Use Topics  . Smoking status: Never Smoker   . Smokeless tobacco: Not on file  . Alcohol Use: Yes   OB History   Grav Para Term Preterm Abortions TAB SAB Ect Mult Living                 Review of Systems  Constitutional: Positive for fever. Negative for chills.  HENT: Positive for congestion, ear pain, rhinorrhea and sore throat.   Respiratory: Negative for shortness of breath.   Gastrointestinal: Negative for nausea and vomiting.  Musculoskeletal: Positive for myalgias.  Neurological:  Positive for headaches. Negative for weakness.   Allergies  Review of patient's allergies indicates no known allergies.  Home Medications   Prior to Admission medications   Medication Sig Start Date End Date Taking? Authorizing Provider  fluticasone (FLONASE) 50 MCG/ACT nasal spray Place 1 spray into both nostrils daily. 06/15/14   Louann SjogrenVictoria L Creech, PA-C  ibuprofen (ADVIL,MOTRIN) 200 MG tablet Take 200 mg by mouth every 6 (six) hours as needed for fever or mild pain.    Historical Provider, MD  ondansetron (ZOFRAN) 4 MG tablet Take 1 tablet (4 mg total) by mouth every 6 (six) hours. 06/15/14   Louann SjogrenVictoria L Creech, PA-C   Triage Vitals: BP 134/64  Pulse 86  Temp(Src) 99 F (37.2 C) (Oral)  Resp 16  SpO2 100%  LMP 06/10/2014  Physical Exam  Nursing note and vitals reviewed. Constitutional: She is oriented to person, place, and time. She appears well-developed and well-nourished. No distress.  HENT:  Head: Normocephalic and atraumatic.  Mouth/Throat: Uvula is midline and mucous membranes are normal. No trismus in the jaw. No uvula swelling. Posterior oropharyngeal erythema present. No oropharyngeal exudate, posterior oropharyngeal edema or tonsillar abscesses.  Eyes: EOM are normal. Pupils are equal, round, and reactive to light.  Neck: Normal range of motion. Neck supple.  Cardiovascular: Normal rate, regular rhythm and normal heart sounds.   Pulmonary/Chest: Effort normal and breath sounds normal. No respiratory distress.  Musculoskeletal: Normal range of motion.  Lymphadenopathy:    She has  no cervical adenopathy.  Neurological: She is alert and oriented to person, place, and time.  Skin: Skin is warm and dry.  Psychiatric: She has a normal mood and affect. Her behavior is normal.    ED Course  Procedures (including critical care time) DIAGNOSTIC STUDIES: Oxygen Saturation is 100% on RA, normal by my interpretation.    COORDINATION OF CARE: 3:40 PM- Patient informed of  current plan for treatment and evaluation and agrees with plan at this time.  Labs Review Labs Reviewed  RAPID STREP SCREEN  CULTURE, GROUP A STREP    I personally performed the services described in this documentation, which was scribed in my presence. The recorded information has been reviewed and is accurate.    Carlyle Dollyhristopher W Channell Quattrone, PA-C 06/18/14 1941

## 2014-06-20 LAB — CULTURE, GROUP A STREP

## 2014-06-20 NOTE — ED Provider Notes (Signed)
Medical screening examination/treatment/procedure(s) were performed by non-physician practitioner and as supervising physician I was immediately available for consultation/collaboration.   EKG Interpretation None       Abdulkareem Badolato, MD 06/20/14 1116 

## 2014-06-21 ENCOUNTER — Telehealth (HOSPITAL_BASED_OUTPATIENT_CLINIC_OR_DEPARTMENT_OTHER): Payer: Self-pay | Admitting: Emergency Medicine

## 2014-06-21 NOTE — Telephone Encounter (Signed)
Post ED Visit - Positive Culture Follow-up: Successful Patient Follow-Up  Culture assessed and recommendations reviewed by: []  Wes Dulaney, Pharm.D., BCPS []  Celedonio MiyamotoJeremy Frens, Pharm.D., BCPS [x]  Georgina PillionElizabeth Martin, Pharm.D., BCPS []  Three LakesMinh Pham, [address] Rainbow BoulevardPharm.D., BCPS, AAHIVP []  Estella HuskMichelle Turner, Pharm.D., BCPS, AAHIVP []  Red ChristiansSamson Lee, Pharm.D. []  Tennis Mustassie Stewart, [place]arm[place]D.  Positive strep culture  [x]  Patient discharged without antimicrobial prescription and treatment is now indicated []  Organism is resistant to prescribed ED discharge antimicrobial []  Patient with positive blood cultures  Changes discussed with ED provider: Arthor CaptainAbigail Harris PA   New antibiotic prescription Amoxicillin 500mg  po bid x 10 days [facility] [place] [facility]  Contacted patient, date 06/21/14, time 1738   Berle MullMiller, Dainel Arcidiacono 06/21/2014, 5:37 PM

## 2014-06-21 NOTE — Progress Notes (Signed)
ED Antimicrobial Stewardship Positive Culture Follow Up   Natalie Orr is an 33 y.o. female who [facility] with a chief complaint of worsening sore throat  Chief Complaint  Patient presents with  . Sore Throat  . Fever    Recent Results (from the past 720 hour(s))  RAPID STREP SCREEN     Status: None   Collection Time    06/15/14  5:08 PM      Result Value Ref Range Status   Streptococcus, Group A Screen (Direct) NEGATIVE  NEGATIVE Final   Comment: (NOTE)     A Rapid Antigen test may result negative if the antigen level in the     sample is below the detection level of this test. The FDA has not     cleared this test as a stand-alone test therefore the rapid antigen     negative result has reflexed to a Group A Strep culture.  CULTURE, GROUP A STREP     Status: None   Collection Time    06/15/14  5:08 PM      Result Value Ref Range Status   Specimen Description THROAT   Final   Special Requests NONE   Final   Culture     Final   Value: STREPTOCOCCUS,BETA HEMOLYTIC NOT GROUP A     [facility] at [facility]   Report Status 06/17/2014 FINAL   Final  RAPID STREP SCREEN     Status: None   Collection Time    06/18/14  3:30 PM      Result Value Ref Range Status   Streptococcus, Group A Screen (Direct) NEGATIVE  NEGATIVE Final   Comment: (NOTE)     A Rapid Antigen test may result negative if the antigen level in the     sample is below the detection level of this test. The FDA has not     cleared this test as a stand-alone test therefore the rapid antigen     negative result has reflexed to a Group A Strep culture.  CULTURE, GROUP A STREP     Status: None   Collection Time    06/18/14  3:30 PM      Result Value Ref Range Status   Specimen Description THROAT   Final   Special Requests NONE   Final   Culture     Final   Value: STREPTOCOCCUS,BETA HEMOLYTIC NOT GROUP A     Performed at [facility]   Report Status 06/20/2014  FINAL   Final    [x]  Patient discharged originally without antimicrobial agent and treatment is now indicated  [age] YOF who [facility] with complains of sore throat, fever, nausea, abdominal pain, fatigue, and body aches. UA and rapid strep was negative and it was deemed likely influenza however decided to treat symptomatically with OTC medications.   [facility] with worsening sore throat. Rapid strep remained negative however the cultures grew out non-group A Streptococcus which is normally a colonizer however can at times be a true pathogen. Given the patient's worsening symptoms - will plan to treat.  New antibiotic prescription: Amoxicillin 500 mg bid x 10 days  ED Provider: Arthor CaptainAbigail Harris, PA-C   Natalie SimsMartin, [name] Orr 06/21/2014, 9:44 AM Infectious Diseases Pharmacist Phone# [phone]

## 2014-06-24 ENCOUNTER — Telehealth (HOSPITAL_COMMUNITY): Payer: Self-pay

## 2014-09-28 ENCOUNTER — Emergency Department (HOSPITAL_COMMUNITY)
Admission: EM | Admit: 2014-09-28 | Discharge: 2014-09-28 | Disposition: A | Discharge status: Home / Self Care | Payer: Self-pay | Attending: Emergency Medicine | Admitting: Emergency Medicine

## 2014-09-28 ENCOUNTER — Encounter (HOSPITAL_COMMUNITY): Payer: Self-pay

## 2014-09-28 DIAGNOSIS — Z7952 Long term (current) use of systemic steroids: Secondary | ICD-10-CM | POA: Insufficient documentation

## 2014-09-28 DIAGNOSIS — Z79899 Other long term (current) drug therapy: Secondary | ICD-10-CM | POA: Insufficient documentation

## 2014-09-28 DIAGNOSIS — K0889 Other specified disorders of teeth and supporting structures: Secondary | ICD-10-CM

## 2014-09-28 DIAGNOSIS — G479 Sleep disorder, unspecified: Secondary | ICD-10-CM | POA: Insufficient documentation

## 2014-09-28 DIAGNOSIS — K088 Other specified disorders of teeth and supporting structures: Secondary | ICD-10-CM | POA: Insufficient documentation

## 2014-09-28 MED ORDER — HYDROCODONE-ACETAMINOPHEN 5-325 MG PO TABS: 1.0000 | ORAL_TABLET | Freq: Four times a day (QID) | ORAL | Status: DC | PRN

## 2014-09-28 MED ORDER — PENICILLIN V POTASSIUM 500 MG PO TABS: 500.0000 mg | ORAL_TABLET | Freq: Four times a day (QID) | ORAL | Status: DC

## 2014-09-28 NOTE — ED Notes (Signed)
Pt states facial swelling to left lower jaw since Sunday.  States comes and go. Painful for 2 days.  Gets worse after eating.

## 2014-09-28 NOTE — ED Provider Notes (Signed)
CSN: [id number]     Arrival date & time 09/28/14  1128 History   This chart was scribed for non-physician practitioner Ivar Drape, PA-C working with Doug Sou, MD by Murriel Hopper, ED Scribe. This patient was seen in room WTR8/WTR8 and the patient's care was started at 12:24 PM.    Chief Complaint  Patient presents with  . Facial Swelling  . Dental Pain   HPI   HPI Comments: Natalie Orr is a 34 y.o. female who presents to the Emergency Department complaining of intermittent left-sided dental and mouth pain with associated swelling that has been present for 4 days.  Left-sided mouth pain Swelling intermittently 4 days  Pt tried advil and Motrin earlier this week Pt states Oragel made it worst   History reviewed. No pertinent past medical history. History reviewed. No pertinent past surgical history. History reviewed. No pertinent family history. History  Substance Use Topics  . Smoking status: Never Smoker   . Smokeless tobacco: Not on file  . Alcohol Use: Yes   OB History    No data available     Review of Systems  Constitutional: Negative for fever and chills.  HENT: Positive for dental problem. Negative for drooling.   Neurological: Negative for speech difficulty.  Psychiatric/Behavioral: Positive for sleep disturbance.      Allergies  Review of patient's allergies indicates no known allergies.  Home Medications   Prior to Admission medications   Medication Sig Start Date End Date Taking? Authorizing Provider  acetaminophen (TYLENOL) 500 MG tablet Take 500 mg by mouth every 6 (six) hours as needed for fever (fever).   Yes Historical Provider, MD  acetaminophen-codeine 120-12 MG/5ML solution Take 10 mLs by mouth every 4 (four) hours as needed for moderate pain (and cough). Patient not taking: Reported on 09/28/2014 06/18/14   Jamesetta Orleans Lawyer, PA-C  Guaifenesin 1200 MG TB12 Take 1 tablet (1,200 mg total) by mouth 2 (two) times daily. Patient not  taking: Reported on 09/28/2014 06/18/14   Jamesetta Orleans Lawyer, PA-C  ibuprofen (ADVIL,MOTRIN) 200 MG tablet Take 400 mg by mouth every 6 (six) hours as needed for fever or mild pain (fever).     Historical Provider, MD  predniSONE (DELTASONE) 50 MG tablet Take 1 tablet (50 mg total) by mouth daily. Patient not taking: Reported on 09/28/2014 06/18/14   Jamesetta Orleans Lawyer, PA-C   BP 120/80 mmHg  Pulse 83  Temp(Src) 98.3 F (36.8 C) (Oral)  Resp 20  SpO2 100%  LMP 09/17/2014 Physical Exam  Constitutional: She is oriented to person, place, and time. She appears well-developed and well-nourished.  HENT:  Head: Normocephalic and atraumatic.  Mouth/Throat:    Poor dentition throughout.  Affected tooth as diagrammed.  No signs of peritonsillar or tonsillar abscess.  No signs of gingival abscess. Oropharynx is clear and without exudates.  Uvula is midline.  Airway is intact. No signs of Ludwig's angina with palpation of oral and sublingual mucosa.   Eyes: Conjunctivae and EOM are normal.  Neck: Normal range of motion.  Cardiovascular: Normal rate.   Pulmonary/Chest: Effort normal.  Abdominal: She exhibits no distension.  Musculoskeletal: Normal range of motion.  Neurological: She is alert and oriented to person, place, and time.  Skin: Skin is warm and dry.  Psychiatric: She has a normal mood and affect. Her behavior is normal. Judgment and thought content normal.  Nursing note and vitals reviewed.   ED Course  Procedures (including critical care time)  DIAGNOSTIC STUDIES: Oxygen  Saturation is 100% on RA, normal by my interpretation.    COORDINATION OF CARE: 12:25 PM Discussed treatment plan with pt at bedside and pt agreed to plan.   Labs Review Labs Reviewed - No data to display  Imaging Review No results found.   EKG Interpretation None      MDM   Final diagnoses:  Pain, dental    Patient with toothache.  No gross abscess.  Exam unconcerning for Ludwig's angina or  spread of infection.  Will treat with penicillin and pain medicine.  Urged patient to follow-up with dentist.     I personally performed the services described in this documentation, which was scribed in my presence. The recorded information has been reviewed and is accurate.     Roxy Horsemanobert Arleen Bar, PA-C 09/28/14 1238  Doug SouSam Jacubowitz, MD 09/28/14 1739

## 2014-09-28 NOTE — Discharge Instructions (Signed)

## 2014-12-02 ENCOUNTER — Encounter (HOSPITAL_COMMUNITY): Payer: Self-pay | Admitting: Emergency Medicine

## 2014-12-02 ENCOUNTER — Emergency Department (HOSPITAL_COMMUNITY)
Admission: EM | Admit: 2014-12-02 | Discharge: 2014-12-02 | Disposition: A | Discharge status: Home / Self Care | Payer: Self-pay | Attending: Emergency Medicine | Admitting: Emergency Medicine

## 2014-12-02 DIAGNOSIS — Z79899 Other long term (current) drug therapy: Secondary | ICD-10-CM | POA: Insufficient documentation

## 2014-12-02 DIAGNOSIS — M546 Pain in thoracic spine: Secondary | ICD-10-CM | POA: Insufficient documentation

## 2014-12-02 DIAGNOSIS — Z792 Long term (current) use of antibiotics: Secondary | ICD-10-CM | POA: Insufficient documentation

## 2014-12-02 DIAGNOSIS — Z3202 Encounter for pregnancy test, result negative: Secondary | ICD-10-CM | POA: Insufficient documentation

## 2014-12-02 DIAGNOSIS — Z7952 Long term (current) use of systemic steroids: Secondary | ICD-10-CM | POA: Insufficient documentation

## 2014-12-02 LAB — POC URINE PREG, ED: PREG TEST UR: NEGATIVE

## 2014-12-02 MED ORDER — DIAZEPAM 5 MG PO TABS: 5.0000 mg | ORAL_TABLET | Freq: Two times a day (BID) | ORAL | Status: DC

## 2014-12-02 MED ORDER — KETOROLAC TROMETHAMINE 60 MG/2ML IM SOLN
60.0000 mg | Freq: Once | INTRAMUSCULAR | Status: AC
  Administered 2014-12-02: 60 mg via INTRAMUSCULAR
  Filled 2014-12-02: qty 2

## 2014-12-02 NOTE — ED Provider Notes (Signed)
CSN: [id number]     Arrival date & time 12/02/14  1102 History  This chart was scribed for non-physician provider Oswaldo Conroy, PA-C, working with Rolan Bucco, MD by Phillis Haggis, ED Scribe. This patient was seen in room WTR9/WTR9 and patient care was started at 12:13 PM.   Chief Complaint  Patient presents with  . Back Pain   Patient is a 34 y.o. female presenting with back pain. The history is provided by the patient. No language interpreter was used.  Back Pain Associated symptoms: numbness   Associated symptoms: no dysuria and no fever     HPI Comments: Natalie Orr is a 34 y.o. female who presents to the Emergency Department complaining of middle and upper back pain onset earlier this morning. Patient states that she woke up with increasing back pain when she started to move around in the bed. She states that the pain is worsened with coughing, and movement. She states that she has not taken anything for the pain. Patient reports numbness in the arms and hands. She denies loss of bladder or bowel control, fevers, chills, difficulty urinating, hematuria.. Patient denies IV drug use, cervical correction, or history of smoking. Patient does not know if she could be pregnant.  History reviewed. No pertinent past medical history. History reviewed. No pertinent past surgical history. No family history on file. History  Substance Use Topics  . Smoking status: Never Smoker   . Smokeless tobacco: Not on file  . Alcohol Use: Yes     Comment: occasional   OB History    No data available     Review of Systems  Constitutional: Negative for fever and chills.  Gastrointestinal: Negative for vomiting, diarrhea and constipation.  Genitourinary: Negative for dysuria, hematuria and difficulty urinating.  Musculoskeletal: Positive for back pain.  Neurological: Positive for numbness.   Allergies  Review of patient's allergies indicates no known allergies.  Home Medications    Prior to Admission medications   Medication Sig Start Date End Date Taking? Authorizing Provider  acetaminophen (TYLENOL) 500 MG tablet Take 500 mg by mouth every 6 (six) hours as needed for fever (fever).    Historical Provider, MD  acetaminophen-codeine 120-12 MG/5ML solution Take 10 mLs by mouth every 4 (four) hours as needed for moderate pain (and cough). Patient not taking: Reported on 09/28/2014 06/18/14   Charlestine Night, PA-C  diazepam (VALIUM) 5 MG tablet Take 1 tablet (5 mg total) by mouth 2 (two) times daily. 12/02/14   Oswaldo Conroy, PA-C  Guaifenesin 1200 MG TB12 Take 1 tablet (1,200 mg total) by mouth 2 (two) times daily. Patient not taking: Reported on 09/28/2014 06/18/14   Charlestine Night, PA-C  HYDROcodone-acetaminophen (NORCO/VICODIN) 5-325 MG per tablet Take 1-2 tablets by mouth every 6 (six) hours as needed for moderate pain or severe pain. 09/28/14   Roxy Horseman, PA-C  ibuprofen (ADVIL,MOTRIN) 200 MG tablet Take 400 mg by mouth every 6 (six) hours as needed for fever or mild pain (fever).     Historical Provider, MD  penicillin v potassium (VEETID) 500 MG tablet Take 1 tablet (500 mg total) by mouth 4 (four) times daily. 09/28/14   Roxy Horseman, PA-C  predniSONE (DELTASONE) 50 MG tablet Take 1 tablet (50 mg total) by mouth daily. Patient not taking: Reported on 09/28/2014 06/18/14   Charlestine Night, PA-C   BP 119/55 mmHg  Pulse 86  Temp(Src) 98.1 F (36.7 C) (Oral)  Resp 20  SpO2 100%  Physical Exam  Constitutional:  She appears well-developed and well-nourished. No distress.  HENT:  Head: Normocephalic and atraumatic.  Eyes: Conjunctivae are normal. Right eye exhibits no discharge. Left eye exhibits no discharge.  Cardiovascular: Normal rate, regular rhythm and normal heart sounds.   Pulmonary/Chest: Effort normal and breath sounds normal. No respiratory distress. She has no wheezes.  Abdominal: Soft. Bowel sounds are normal. She exhibits no distension.  There is no tenderness.  Musculoskeletal:  No midline back tenderness, step off or crepitus. Left sided upper back tenderness to palpation.    Neurological: She is alert. Coordination normal.  Equal muscle tone. 5/5 strength in lower extremities. DTR equal and intact. Negative straight leg test. Normal gait.   Skin: Skin is warm and dry. She is not diaphoretic.  Nursing note and vitals reviewed.   ED Course  Procedures (including critical care time) DIAGNOSTIC STUDIES: Oxygen Saturation is 100% on room air, normal by my interpretation.    COORDINATION OF CARE: 12:16 PM-Discussed treatment plan which includes pregnancy test and Toradol with pt at bedside and pt agreed to plan.   Labs Review Labs Reviewed  POC URINE PREG, ED   Imaging Review No results found.   EKG Interpretation None      Meds given in ED:  Medications  ketorolac (TORADOL) injection 60 mg (60 mg Intramuscular Given 12/02/14 1249)    Discharge Medication List as of 12/02/2014 12:55 PM    START taking these medications   Details  diazepam (VALIUM) 5 MG tablet Take 1 tablet (5 mg total) by mouth 2 (two) times daily., Starting 12/02/2014, Until Discontinued, Print          MDM   Final diagnoses:  Left-sided thoracic back pain   Patient presenting with left-sided thoracic back pain described as a spasm that is worse with movement. Lungs clear to auscultation bilaterally no respiratory distress. No tachycardia or hypoxia. I doubt pulmonary involvement. Normal neurological exam. Cauda equina. Patient given prescription for Valium and is to follow-up with PCP and orthophoric persistent symptoms.  Discussed return precautions with patient. Discussed all results and patient verbalizes understanding and agrees with plan.  I personally performed the services described in this documentation, which was scribed in my presence. The recorded information has been reviewed and is accurate.   Oswaldo ConroyVictoria Gabriell Casimir,  PA-C 12/02/14 1935  Rolan BuccoMelanie Belfi, MD 12/07/14 [id number]

## 2014-12-02 NOTE — Discharge Instructions (Signed)
Return to the emergency room with worsening of symptoms, new symptoms or with symptoms that are concerning, , especially fevers, loss of control of bladder or bowels, numbness or tingling around genital region or anus, weakness. RICE: Rest, Ice (three cycles of 20 mins on, off at least twice a day), compression/brace, elevation. Heating pad works well for back pain. Ibuprofen  (2 tablets ) every 5-6 hours for 3-5 days. Valium for severe pain. Do not operate machinery, drive or drink alcohol while taking narcotics or muscle relaxers or valium. Follow up with PCP/orthopedist if symptoms worsen or are persistent. Use below resources to establish care with primary care doctor.   Back Exercises Back exercises help treat and prevent back injuries. The goal of back exercises is to increase the strength of your abdominal and back muscles and the flexibility of your back. These exercises should be started when you no longer have back pain. Back exercises include:  Pelvic Tilt. Lie on your back with your knees bent. Tilt your pelvis until the lower part of your back is against the floor. Hold this position 5 to 10 sec and repeat 5 to 10 times.  Knee to Chest. Pull first 1 knee up against your chest and hold for 20 to 30 seconds, repeat this with the other knee, and then both knees. This may be done with the other leg straight or bent, whichever feels better.  Sit-Ups or Curl-Ups. Bend your knees 90 degrees. Start with tilting your pelvis, and do a partial, slow sit-up, lifting your trunk only 30 to 45 degrees off the floor. Take at least 2 to 3 seconds for each sit-up. Do not do sit-ups with your knees out straight. If partial sit-ups are difficult, simply do the above but with only tightening your abdominal muscles and holding it as directed.  Hip-Lift. Lie on your back with your knees flexed 90 degrees. Push down with your feet and shoulders as you raise your hips a couple inches off the  floor; hold for 10 seconds, repeat 5 to 10 times.  Back arches. Lie on your stomach, propping yourself up on bent elbows. Slowly press on your hands, causing an arch in your low back. Repeat 3 to 5 times. Any initial stiffness and discomfort should lessen with repetition over time.  Shoulder-Lifts. Lie face down with arms beside your body. Keep hips and torso pressed to floor as you slowly lift your head and shoulders off the floor. Do not overdo your exercises, especially in the beginning. Exercises may cause you some mild back discomfort which lasts for a few minutes; however, if the pain is more severe, or lasts for more than 15 minutes, do not continue exercises until you see your caregiver. Improvement with exercise therapy for back problems is slow.  See your caregivers for assistance with developing a proper back exercise program. Document Released: 09/17/2004 Document Revised: 11/02/2011 Document Reviewed: 06/11/2011 [facility] Patient Information 2015 [place], [place]. This information is not intended to replace advice given to you by your health care provider. Make sure you discuss any questions you have with your health care provider.   Emergency Department Resource Guide 1) Find a Doctor and Pay Out of Pocket Although you won't have to find out who is covered by your insurance plan, it is a good idea to ask around and get recommendations. You will then need to call the office and see if the doctor you have chosen will accept you as a new patient and what types  of options they offer for patients who are self-pay. Some doctors offer discounts or will set up payment plans for their patients who do not have insurance, but you will need to ask so you aren't surprised when you get to your appointment.  2) Contact Your Local Health Department Not all health departments have doctors that can see patients for sick visits, but many do, so it is worth a call to see if yours does. If you don't know  where your local health department is, you can check in your phone book. The CDC also has a tool to help you locate your state's health department, and many state websites also have listings of all of their local health departments.  3) Find a Walk-in Clinic If your illness is not likely to be very severe or complicated, you may want to try a walk in clinic. These are popping up all over the country in pharmacies, drugstores, and shopping centers. They're usually staffed by nurse practitioners or physician assistants that have been trained to treat common illnesses and complaints. They're usually fairly quick and inexpensive. However, if you have serious medical issues or chronic medical problems, these are probably not your best option.  No Primary Care Doctor: - Call Health Connect at  [phone] - they can help you locate a primary care doctor that  accepts your insurance, provides certain services, etc. - Physician Referral Service- [phone]  Chronic Pain Problems: Organization         Address  Phone   Notes  [facility]  [phone] Patients need to be referred by their primary care doctor.   Medication Assistance: Organization         Address  Phone   Notes  [facility] Medication [facility] [address] [place] [place], [place] [address] [phone] --Must be a resident of [facility] -- Must have NO insurance coverage whatsoever (no Medicaid/ Medicare, etc.) -- The pt. MUST have a primary care doctor that directs their care regularly and follows them in the community   [facility]  [phone]   [employer]  [phone]    Agencies that provide inexpensive medical care: Organization         Address  Phone   Notes  [facility]  [phone]   [facility]    [phone]   [facility] [address] [place], [place] [address] [phone]   [facility]  [address] [place] [place] [phone]   Planned Parenthood    [phone]   [facility]    [phone]   [facility]  [address], [place] Phone:  [phone], Fax:  [phone] Hours of Operation:  9 am - 6 pm, M-F.  Also accepts Medicaid/Medicare and self-pay.  [facility]  [address], [place] Phone: [phone], Fax: [phone]. Hours of Operation:  8:30 am - 5:30 pm, M-F.  Also accepts Medicaid and self-pay.  [facility] [place] [address], [place] Phone: [phone]   [facility] [address] Natasha Bence [place], [place] [phone], Ext. [phone] Mondays & Thursdays: 7-9 AM.  First 15 patients are seen on a first come, first serve basis.    Medicaid-accepting [facility] Providers:  Organization         Address  Phone   Notes  [facility] [address], [place] [phone]) [phone] Also accepts self-pay patients.  [facility] [address] [place], [place]  [phone]) [phone]   [facility] [address], [place] [phone]   [facility] [address] [place] [address], [place] [phone]) [phone]   Renaye RakersVeita Bland [address], [place]   [phone] Only accepts [place] Access [place] patients after they have their name applied to their card.   Self-Pay (no insurance) in [facility]:  Organization         Address  Phone   Notes  Sickle Cell Patients, [facility] [address], [place] [phone]   [facility] [address], [place] [phone]) [phone]   [facility]  [address], [place] [phone]) [phone]   [facility]/Dr. Osei-Bonsu  [address] [place] [address], [place] or [phone], [place] [phone] Phone number for both  [place] and [place] locations is the same.  [facility] [address], [place] [phone]) [phone]   [facility] [address] [place] [address], [place] or [address] [phone]   [facility] [address], [place] [phone]) [phone], phone; [phone]) [phone], fax Sees patients 1st and 3rd Saturday of every month.  Must not qualify for public or private insurance (i.e. Medicaid, Medicare, [place] Health Choice, Veterans' Benefits)  Household income should be no more than 200% of the poverty level The clinic cannot treat you if you are pregnant or think you are pregnant  Sexually transmitted diseases are not treated at the clinic.    Dental Care: Organization         Address  Phone  Notes  [facility] [address], [place] [phone]) [phone] Accepts children up to age 34 who are enrolled in [place] or [place] Health Choice; pregnant women with a Medicaid card; and children who have applied for Medicaid or [place] Health Choice, but were declined, whose parents can pay a reduced fee at time of service.  [facility] of [facility] [place]  [address], [place] [phone] Accepts children up to age 34 who are enrolled in [place] or [place] Health Choice; pregnant women with a Medicaid card; and children who have applied for Medicaid or [place] Health Choice, but were declined, whose parents can pay a reduced fee at time of service.  [facility]  [address], [place] [phone] Patients are seen by appointment only. Walk-ins are not accepted. [facility] will see patients 34 years of age and older. Monday - Tuesday (8am-5pm) Most Wednesdays (8:30-5pm) $30 per visit, cash only  [facility]  [address], [facility] [place] [phone] Patients are seen by appointment only. Walk-ins are not  accepted. [facility] will see patients 34 years of age and older. One Wednesday Evening (Monthly: Volunteer Based).  $30 per visit, cash only  [employer] [facility]  [phone]) [phone] for adults; Children under age 734, call Graduate Pediatric Dentistry at [phone]. Children aged 764-14, please call [phone] to request a pediatric application.  Dental services are provided in all areas of dental care including fillings, crowns and bridges, complete and partial  dentures, implants, gum treatment, root canals, and extractions. Preventive care is also provided. Treatment is provided to both adults and children. Patients are selected via a lottery and there is often a waiting list.   [facility] [address], [place]  [phone] [email or web address]   [facility] [address] [place], [place] [phone], Ext. [phone] Second and Fourth Thursday of each month, opens at 6:30 AM; Clinic ends at 9 AM.  Patients are seen on a first-come first-served basis, and a limited number are seen during each clinic.   [facility]  [address] [place], [place] [phone]   Eligibility Requirements You must have lived in [place], [place], or [place] counties for at least the last three months.   You cannot be eligible for state or federal sponsored [place], including [employer], [place], or [employer].   You generally cannot be eligible for healthcare insurance through your employer.    How to apply: Eligibility screenings are held every Tuesday and Wednesday afternoon from 1:00 pm until 4:00 pm. You do not need an appointment for the interview!  [facility] [address] [place], [place] [phone]   [facility] Health Department  [phone]   [facility] Health Department  [phone]   [facility] Health Department  [phone]    Behavioral Health Resources in the  Community: Intensive Outpatient Programs Organization         Address  Phone  Notes  [facility] Services [address], [place], [place] [phone]   [facility] [address] [place], [place] [phone]   [facility] [address], [place], [place]  [phone]   [facility] Mental Health [address]  [place], [place] [phone] or [phone]   Substance Abuse Resources Organization         Address  Phone  Notes  Alcohol [facility] Drug Services  [phone]   [facility]  [phone]   [facility]  [phone]   Floydene Flock  [phone]   Residential & Outpatient Substance Abuse Program  [phone]   Psychological Services Organization         Address  Phone  Notes  [facility] Health  [phone]   [facility] Services  [phone]   [facility] Mental Health [address], [place] [phone] or [phone]    Mobile Crisis Teams Organization         Address  Phone  Notes  [facility], Mobile Crisis Care Unit  [phone]   [facility]  [address] [place], [place] [phone]   Doristine Locks [address] [place] [phone]    Self-Help/Support Groups Organization         Address  Phone             Notes  [facility] [place] - variety of support groups  [phone]- I7437963 Call for more information  Narcotics Anonymous (NA), [facility] [address], [employer] [place]  2 meetings at this location   Statistician         Address  Phone  Notes  [facility] [address],    [place]  [phone]   [facility]  [address] [place] [address], [place], [place] [phone]   [facility] Treatment Facility [address] [place], [place] [phone] Admissions: 8am-3pm M-F  Incentives  Substance Abuse Treatment Center [address]    [place], [place] [phone]   [facility] [address] [place], [place], [place] [phone]   [facility] [address]  [place], [place] [phone]   [facility] - Intensive Outp[address], [place], [place] [phone]   [facility] ([facility] [address] [place]  [place], [place] [phone] or [phone]   [facility] (RTS) [address] [address] [place], [place] [phone] Accepts Medicaid  [facility] [address]  [place] [phone] Substance Abuse/Addiction Treatment   [facility] Organization         Address  Phone  Notes  [facility]  [phone]   Angie Fava, PhD [address] Ervin Knack [place], [place]   [phone] or ([phone]   [facility] Behavioral   [address] [place], [place] [phone]   [facility] [address], [place], [place] [phone] Insurance/Medicaid/sponsorship through [facility] and Families [address]                                    [place], [place] [phone] Therapy/tele-psych/case  [facility] [address], [place] [phone]    Dr. Lolly Mustache  [phone]   [facility]  [employer] [facility] Dept. 1) [address] [place] 2) [address] [place] 3)  [address], [place] [phone]  [phone]   [facility] Child Abuse Hotline (816)114-2633 or [phone] (After Hours)

## 2014-12-02 NOTE — ED Notes (Signed)
Pt states that this morning when she woke up she moved around in the bed and started having pain in left mi-upper back area.  Pt states that she has had muscle spasms before and this is different bc those normally go away but this pain hasnt.

## 2015-04-06 ENCOUNTER — Encounter (HOSPITAL_COMMUNITY): Payer: Self-pay | Admitting: Emergency Medicine

## 2015-04-06 ENCOUNTER — Emergency Department (HOSPITAL_COMMUNITY): Payer: Self-pay

## 2015-04-06 ENCOUNTER — Emergency Department (HOSPITAL_COMMUNITY)
Admission: EM | Admit: 2015-04-06 | Discharge: 2015-04-06 | Disposition: A | Discharge status: Home / Self Care | Payer: Self-pay | Attending: Emergency Medicine | Admitting: Emergency Medicine

## 2015-04-06 DIAGNOSIS — Z792 Long term (current) use of antibiotics: Secondary | ICD-10-CM | POA: Insufficient documentation

## 2015-04-06 DIAGNOSIS — Y9389 Activity, other specified: Secondary | ICD-10-CM | POA: Insufficient documentation

## 2015-04-06 DIAGNOSIS — Z79899 Other long term (current) drug therapy: Secondary | ICD-10-CM | POA: Insufficient documentation

## 2015-04-06 DIAGNOSIS — S93402A Sprain of unspecified ligament of left ankle, initial encounter: Secondary | ICD-10-CM | POA: Insufficient documentation

## 2015-04-06 DIAGNOSIS — Y9289 Other specified places as the place of occurrence of the external cause: Secondary | ICD-10-CM | POA: Insufficient documentation

## 2015-04-06 DIAGNOSIS — W108XXA Fall (on) (from) other stairs and steps, initial encounter: Secondary | ICD-10-CM | POA: Insufficient documentation

## 2015-04-06 DIAGNOSIS — Y998 Other external cause status: Secondary | ICD-10-CM | POA: Insufficient documentation

## 2015-04-06 MED ORDER — HYDROCODONE-ACETAMINOPHEN 5-325 MG PO TABS
1.0000 | ORAL_TABLET | Freq: Once | ORAL | Status: AC
  Administered 2015-04-06: 1 via ORAL
  Filled 2015-04-06: qty 1

## 2015-04-06 MED ORDER — HYDROCODONE-ACETAMINOPHEN 5-325 MG PO TABS: 1.0000 | ORAL_TABLET | Freq: Four times a day (QID) | ORAL | Status: DC | PRN

## 2015-04-06 MED ORDER — NAPROXEN 500 MG PO TABS: 500.0000 mg | ORAL_TABLET | Freq: Two times a day (BID) | ORAL | Status: DC | PRN

## 2015-04-06 NOTE — Discharge Instructions (Signed)
Wear ankle brace for at least 2 weeks for stabilization of ankle. Use crutches as needed for comfort. Ice and elevate ankle throughout the day. Alternate between naprosyn and norco for pain relief. Do not drive or operate machinery with pain medication use. Call orthopedic follow up today or tomorrow to schedule followup appointment for recheck of ongoing ankle pain in 1-2 weeks that can be canceled with a 24-48 hour notice if complete resolution of pain. Return to the ER for changes or worsening symptoms.    Ankle Sprain An ankle sprain is an injury to the strong, fibrous tissues (ligaments) that hold your ankle bones together.  HOME CARE   Put ice on your ankle for 1-2 days or as told by your doctor.  Put ice in a plastic bag.  Place a towel between your skin and the bag.  Leave the ice on for 15-20 minutes at a time, every 2 hours while you are awake.  Only take medicine as told by your doctor.  Raise (elevate) your injured ankle above the level of your heart as much as possible for 2-3 days.  Use crutches if your doctor tells you to. Slowly put your own weight on the affected ankle. Use the crutches until you can walk without pain.  If you have a plaster splint:  Do not rest it on anything harder than a pillow for 24 hours.  Do not put weight on it.  Do not get it wet.  Take it off to shower or bathe.  If given, use an elastic wrap or support stocking for support. Take the wrap off if your toes lose feeling (numb), tingle, or turn cold or blue.  If you have an air splint:  Add or let out air to make it comfortable.  Take it off at night and to shower and bathe.  Wiggle your toes and move your ankle up and down often while you are wearing it. GET HELP IF:  You have rapidly increasing bruising or puffiness (swelling).  Your toes feel very cold.  You lose feeling in your foot.  Your medicine does not help your pain. GET HELP RIGHT AWAY IF:   Your toes lose feeling  (numb) or turn blue.  You have severe pain that is increasing. MAKE SURE YOU:   Understand these instructions.  Will watch your condition.  Will get help right away if you are not doing well or get worse. Document Released: 01/27/2008 Document Revised: 12/25/2013 Document Reviewed: 02/22/2012 [facility] Patient Information 2015 [place], [place]. This information is not intended to replace advice given to you by your health care provider. Make sure you discuss any questions you have with your health care provider.  Cryotherapy Cryotherapy is when you put ice on your injury. Ice helps lessen pain and puffiness (swelling) after an injury. Ice works the best when you start using it in the first 24 to 48 hours after an injury. HOME CARE  Put a dry or damp towel between the ice pack and your skin.  You may press gently on the ice pack.  Leave the ice on for no more than 10 to 20 minutes at a time.  Check your skin after 5 minutes to make sure your skin is okay.  Rest at least 20 minutes between ice pack uses.  Stop using ice when your skin loses feeling (numbness).  Do not use ice on someone who cannot tell you when it hurts. This includes small children and people with memory problems (  dementia). GET HELP RIGHT AWAY IF:  You have white spots on your skin.  Your skin turns blue or pale.  Your skin feels waxy or hard.  Your puffiness gets worse. MAKE SURE YOU:   Understand these instructions.  Will watch your condition.  Will get help right away if you are not doing well or get worse. Document Released: 01/27/2008 Document Revised: 11/02/2011 Document Reviewed: 04/02/2011 [facility] Patient Information 2015 [place], [place]. This information is not intended to replace advice given to you by your health care provider. Make sure you discuss any questions you have with your health care provider.

## 2015-04-06 NOTE — ED Provider Notes (Signed)
CSN: [id number]     Arrival date & time 04/06/15  1103 History   First MD Initiated Contact with Patient 04/06/15 1114     Chief Complaint  Patient presents with  . Ankle Pain     (Consider location/radiation/quality/duration/timing/severity/associated sxs/prior Treatment) HPI Comments: Natalie Orr is a 34 y.o. female who presents to the ED with complaints of left ankle pain after mechanical fall when she slipped down 5 steps around 2 AM this morning. She reports the pain is 10/10 constant sharp and throbbing pain in the left ankle, nonradiating, worse with weightbearing, with no treatments tried prior to arrival. Associated symptoms include bruising and swelling to the ankle. She denies any head injury or loss of consciousness, headache, vision changes, syncope, neck or back pain, numbness, tingling, weakness, fevers or chills last night, chest pain, shortness breath, abdominal pain, nausea, or vomiting.  Patient is a 34 y.o. female presenting with ankle pain. The history is provided by the patient. No language interpreter was used.  Ankle Pain Location:  Ankle Time since incident:  9 hours Injury: yes   Mechanism of injury: fall   Fall:    Fall occurred:  Down stairs   Height of fall:  5 steps   Impact surface:  Hard floor   Point of impact:  Feet   Entrapped after fall: no   Ankle location:  L ankle Pain details:    Quality:  Throbbing and sharp   Radiates to:  Does not radiate   Severity:  Severe   Onset quality:  Sudden   Duration:  9 hours   Timing:  Constant   Progression:  Unchanged Chronicity:  New Prior injury to area:  No Relieved by:  None tried Worsened by:  Bearing weight Ineffective treatments:  None tried Associated symptoms: decreased ROM (due to pain) and swelling   Associated symptoms: no back pain, no fever, no muscle weakness, no numbness and no tingling     History reviewed. No pertinent past medical history. History reviewed. No pertinent past  surgical history. No family history on file. Social History  Substance Use Topics  . Smoking status: Never Smoker   . Smokeless tobacco: None  . Alcohol Use: Yes     Comment: occasional   OB History    No data available     Review of Systems  Constitutional: Negative for fever and chills.  HENT: Negative for facial swelling (no head inj).   Eyes: Negative for visual disturbance.  Respiratory: Negative for shortness of breath.   Cardiovascular: Negative for chest pain.  Gastrointestinal: Negative for nausea, vomiting and abdominal pain.  Musculoskeletal: Positive for joint swelling and arthralgias. Negative for myalgias and back pain.  Skin: Positive for color change. Negative for wound.  Allergic/Immunologic: Negative for immunocompromised state.  Neurological: Negative for syncope, weakness, numbness and headaches.  Psychiatric/Behavioral: Negative for confusion.   10 Systems reviewed and are negative for acute change except as noted in the HPI.    Allergies  Review of patient's allergies indicates no known allergies.  Home Medications   Prior to Admission medications   Medication Sig Start Date End Date Taking? Authorizing Provider  acetaminophen (TYLENOL) 500 MG tablet Take 500 mg by mouth every 6 (six) hours as needed for fever (fever).    Historical Provider, MD  acetaminophen-codeine 120-12 MG/5ML solution Take 10 mLs by mouth every 4 (four) hours as needed for moderate pain (and cough). Patient not taking: Reported on 09/28/2014 06/18/14   Cristal Deer  Lawyer, PA-C  diazepam (VALIUM) 5 MG tablet Take 1 tablet (5 mg total) by mouth 2 (two) times daily. 12/02/14   Oswaldo Conroy, PA-C  Guaifenesin 1200 MG TB12 Take 1 tablet (1,200 mg total) by mouth 2 (two) times daily. Patient not taking: Reported on 09/28/2014 06/18/14   Charlestine Night, PA-C  HYDROcodone-acetaminophen (NORCO/VICODIN) 5-325 MG per tablet Take 1-2 tablets by mouth every 6 (six) hours as needed for  moderate pain or severe pain. 09/28/14   Roxy Horseman, PA-C  ibuprofen (ADVIL,MOTRIN) 200 MG tablet Take 400 mg by mouth every 6 (six) hours as needed for fever or mild pain (fever).     Historical Provider, MD  penicillin v potassium (VEETID) 500 MG tablet Take 1 tablet (500 mg total) by mouth 4 (four) times daily. 09/28/14   Roxy Horseman, PA-C  predniSONE (DELTASONE) 50 MG tablet Take 1 tablet (50 mg total) by mouth daily. Patient not taking: Reported on 09/28/2014 06/18/14   Charlestine Night, PA-C   BP 103/60 mmHg  Pulse 83  Temp(Src) 98 F (36.7 C) (Oral)  Resp 16  SpO2 98% Physical Exam  Constitutional: She is oriented to person, place, and time. Vital signs are normal. She appears well-developed and well-nourished.  Non-toxic appearance. No distress.  Afebrile, nontoxic, NAD  HENT:  Head: Normocephalic and atraumatic.  Mouth/Throat: Mucous membranes are normal.  Eyes: Conjunctivae and EOM are normal. Right eye exhibits no discharge. Left eye exhibits no discharge.  Neck: Normal range of motion. Neck supple.  Cardiovascular: Normal rate and intact distal pulses.   Pulmonary/Chest: Effort normal. No respiratory distress.  Abdominal: Normal appearance. She exhibits no distension.  Musculoskeletal:       Left ankle: She exhibits decreased range of motion (due to pain), swelling and ecchymosis. She exhibits no deformity and normal pulse. Tenderness. Lateral malleolus, medial malleolus and head of 5th metatarsal tenderness found. Achilles tendon normal.       Feet:  L ankle with mildly diminished ROM due to pain, mild swelling to lateral malleolus, no deformity, with moderate TTP of b/l malleoli extending towards metartarsals diffusely, but no TTP or swelling of calf. No break in skin. Mild bruising without erythema. No warmth. Achilles intact. Good pedal pulse and cap refill of all toes. Wiggling toes without difficulty. Sensation grossly intact.   Neurological: She is alert and  oriented to person, place, and time. She has normal strength. No sensory deficit.  Skin: Skin is warm, dry and intact. Bruising noted. No rash noted.  Bruise to L ankle as noted above  Psychiatric: She has a normal mood and affect. Her behavior is normal.  Nursing note and vitals reviewed.   ED Course  Procedures (including critical care time) Labs Review Labs Reviewed - No data to display  Imaging Review Dg Ankle Complete Left  04/06/2015   CLINICAL DATA:  Larey Seat down stairs, left ankle and left foot pain  EXAM: LEFT ANKLE COMPLETE - 3+ VIEW  COMPARISON:  None.  FINDINGS: Three views of left ankle submitted. No acute fracture or subluxation. Ankle mortise is preserved.  IMPRESSION: Negative.   Electronically Signed   By: Natasha Mead M.D.   On: 04/06/2015 12:07   Dg Foot Complete Left  04/06/2015   CLINICAL DATA:  Larey Seat down stairs, left foot pain  EXAM: LEFT FOOT - COMPLETE 3+ VIEW  COMPARISON:  None.  FINDINGS: Three views of the left foot submitted. No acute fracture or subluxation. No radiopaque foreign body.  IMPRESSION: Negative.  Electronically Signed   By: Natasha Mead M.D.   On: 04/06/2015 12:07   I, Camprubi-Soms, [place], personally reviewed and evaluated these images and lab results as part of my medical decision-making.   EKG Interpretation None      MDM   Final diagnoses:  Ankle sprain, left, initial encounter    34 y.o. female here with L ankle pain after mechanical fall down 5 steps. NVI with soft compartments. Tenderness to b/l malleoli and diffuse metatarsal tenderness. Mild swelling and bruising. Will obtain imaging. Will give pain meds and reassess.  12:46 PM Xrays negative. Likely sprain. Will apply ASO brace and crutches, and have her f/up with ortho in 1-2wks for ongoing symptoms. Discussed RICE therapy. Will send home with pain meds. I explained the diagnosis and have given explicit precautions to return to the ER including for any other new or worsening  symptoms. The patient understands and accepts the medical plan as it's been dictated and I have answered their questions. Discharge instructions concerning home care and prescriptions have been given. The patient is STABLE and is discharged to home in good condition.  BP 103/60 mmHg  Pulse 83  Temp(Src) 98 F (36.7 C) (Oral)  Resp 16  SpO2 98%  LMP 03/13/2015  Meds ordered this encounter  Medications  . HYDROcodone-acetaminophen (NORCO/VICODIN) 5-325 MG per tablet 1 tablet    Sig:   . HYDROcodone-acetaminophen (NORCO) 5-325 MG per tablet    Sig: Take 1 tablet by mouth every 6 (six) hours as needed for severe pain.    Dispense:  6 tablet    Refill:  0    Order Specific Question:  Supervising Provider    Answer:  MILLER, BRIAN [[id number]]  . naproxen (NAPROSYN) 500 MG tablet    Sig: Take 1 tablet (500 mg total) by mouth 2 (two) times daily as needed for mild pain, moderate pain or headache (TAKE WITH MEALS.).    Dispense:  20 tablet    Refill:  0    Order Specific Question:  Supervising Provider    Answer:  Eber Hong [[id number]]      Kendra Woolford Camprubi-Soms, PA-C 04/06/15 1247  Laurence Spates, MD 04/06/15 [phone]

## 2015-04-06 NOTE — ED Notes (Signed)
Patient reports falling down stairs this am, here with complaints of left ankle pain.

## 2016-09-01 ENCOUNTER — Encounter (HOSPITAL_COMMUNITY): Payer: Self-pay | Admitting: Emergency Medicine

## 2016-09-01 ENCOUNTER — Emergency Department (HOSPITAL_COMMUNITY): Payer: Self-pay

## 2016-09-01 ENCOUNTER — Emergency Department (HOSPITAL_COMMUNITY)
Admission: EM | Admit: 2016-09-01 | Discharge: 2016-09-01 | Disposition: A | Discharge status: Home / Self Care | Payer: Self-pay | Attending: Emergency Medicine | Admitting: Emergency Medicine

## 2016-09-01 DIAGNOSIS — R059 Cough, unspecified: Secondary | ICD-10-CM

## 2016-09-01 DIAGNOSIS — N390 Urinary tract infection, site not specified: Secondary | ICD-10-CM | POA: Insufficient documentation

## 2016-09-01 DIAGNOSIS — J069 Acute upper respiratory infection, unspecified: Secondary | ICD-10-CM | POA: Insufficient documentation

## 2016-09-01 DIAGNOSIS — R110 Nausea: Secondary | ICD-10-CM

## 2016-09-01 DIAGNOSIS — R05 Cough: Secondary | ICD-10-CM

## 2016-09-01 LAB — URINALYSIS, ROUTINE W REFLEX MICROSCOPIC
Bilirubin Urine: NEGATIVE
GLUCOSE, UA: NEGATIVE mg/dL
Ketones, ur: NEGATIVE mg/dL
Nitrite: POSITIVE — AB
Protein, ur: NEGATIVE mg/dL
SPECIFIC GRAVITY, URINE: 1.012 (ref 1.005–1.030)
pH: 5 (ref 5.0–8.0)

## 2016-09-01 LAB — INFLUENZA PANEL BY PCR (TYPE A & B)
INFLAPCR: POSITIVE — AB
INFLBPCR: NEGATIVE

## 2016-09-01 LAB — COMPREHENSIVE METABOLIC PANEL
ALK PHOS: 76 U/L (ref 38–126)
ALT: 15 U/L (ref 14–54)
AST: 19 U/L (ref 15–41)
Albumin: 4.3 g/dL (ref 3.5–5.0)
Anion gap: 7 (ref 5–15)
BUN: 8 mg/dL (ref 6–20)
CO2: 27 mmol/L (ref 22–32)
CREATININE: 0.68 mg/dL (ref 0.44–1.00)
Calcium: 8.8 mg/dL — ABNORMAL LOW (ref 8.9–10.3)
Chloride: 105 mmol/L (ref 101–111)
GFR calc Af Amer: >60 mL/min (ref >60)
GFR calc non Af Amer: >60 mL/min (ref >60)
Glucose, Bld: 90 mg/dL (ref 65–99)
Potassium: 4.2 mmol/L (ref 3.5–5.1)
SODIUM: 139 mmol/L (ref 135–145)
Total Bilirubin: 0.3 mg/dL (ref 0.3–1.2)
Total Protein: 8.2 g/dL — ABNORMAL HIGH (ref 6.5–8.1)

## 2016-09-01 LAB — CBC
HCT: 33.7 % — ABNORMAL LOW (ref 36.0–46.0)
Hemoglobin: 10.9 g/dL — ABNORMAL LOW (ref 12.0–15.0)
MCH: 25.4 pg — AB (ref 26.0–34.0)
MCHC: 32.3 g/dL (ref 30.0–36.0)
MCV: 78.6 fL (ref 78.0–100.0)
Platelets: 310 10*3/uL (ref 150–400)
RBC: 4.29 MIL/uL (ref 3.87–5.11)
RDW: 14.5 % (ref 11.5–15.5)
WBC: 4.5 10*3/uL (ref 4.0–10.5)

## 2016-09-01 LAB — LIPASE, BLOOD: Lipase: 29 U/L (ref 11–51)

## 2016-09-01 LAB — POC URINE PREG, ED: Preg Test, Ur: NEGATIVE

## 2016-09-01 MED ORDER — ONDANSETRON HCL 4 MG PO TABS: 4.0000 mg | ORAL_TABLET | Freq: Three times a day (TID) | ORAL | 0 refills | Status: DC | PRN

## 2016-09-01 MED ORDER — ONDANSETRON HCL 4 MG/2ML IJ SOLN
4.0000 mg | Freq: Once | INTRAMUSCULAR | Status: AC
  Administered 2016-09-01: 4 mg via INTRAVENOUS
  Filled 2016-09-01: qty 2

## 2016-09-01 MED ORDER — BENZONATATE 100 MG PO CAPS
100.0000 mg | ORAL_CAPSULE | Freq: Once | ORAL | Status: AC
  Administered 2016-09-01: 100 mg via ORAL
  Filled 2016-09-01: qty 1

## 2016-09-01 MED ORDER — BENZONATATE 100 MG PO CAPS: 100.0000 mg | ORAL_CAPSULE | Freq: Three times a day (TID) | ORAL | 0 refills | Status: DC | PRN

## 2016-09-01 MED ORDER — ACETAMINOPHEN 325 MG PO TABS
650.0000 mg | ORAL_TABLET | Freq: Once | ORAL | Status: AC
  Administered 2016-09-01: 650 mg via ORAL
  Filled 2016-09-01: qty 2

## 2016-09-01 MED ORDER — CEPHALEXIN 500 MG PO CAPS: ORAL_CAPSULE | ORAL | 0 refills | Status: DC

## 2016-09-01 MED ORDER — SODIUM CHLORIDE 0.9 % IV BOLUS (SEPSIS)
1000.0000 mL | Freq: Once | INTRAVENOUS | Status: AC
  Administered 2016-09-01: 1000 mL via INTRAVENOUS

## 2016-09-01 MED ORDER — DEXTROSE 5 % IV SOLN
1.0000 g | Freq: Once | INTRAVENOUS | Status: AC
  Administered 2016-09-01: 1 g via INTRAVENOUS
  Filled 2016-09-01 (×2): qty 10

## 2016-09-01 NOTE — ED Provider Notes (Signed)
[facility]-EMERGENCY DEPT Provider Note   CSN: [id number] Arrival date & time: 09/01/16  1008     History   Chief Complaint Chief Complaint  Patient presents with  . Cough  . Diarrhea    HPI Natalie Orr is a 36 y.o. female.  36 yo F here with a few days of progressively worsening cough, fever, body aches, and loose stools. Has had malaise as well. tmax of 102. No sick contacts. Nausea, no vomting. No chest pain outside of coughing. Decreased PO intake. Has been sick like this before but not recently. Hasn't tried anything to help with symptoms. Has had some dysuria and increased frequency.      History reviewed. No pertinent past medical history.  There are no active problems to display for this patient.   History reviewed. No pertinent surgical history.  OB History    No data available       Home Medications    Prior to Admission medications   Medication Sig Start Date End Date Taking? Authorizing Provider  guaiFENesin (MUCINEX) 600 MG 12 hr tablet Take 600 mg by mouth 2 (two) times daily as needed for cough.   Yes Historical Provider, MD  acetaminophen (TYLENOL) 500 MG tablet Take 500 mg by mouth every 6 (six) hours as needed for fever (fever).    Historical Provider, MD  benzonatate (TESSALON) 100 MG capsule Take 1 capsule (100 mg total) by mouth 3 (three) times daily as needed for cough. 09/01/16   Marily MemosJason Khristopher Kapaun, MD  cephALEXin (KEFLEX) 500 MG capsule 2 caps po bid x 7 days 09/01/16   Marily MemosJason Janos Shampine, MD  ibuprofen (ADVIL,MOTRIN) 200 MG tablet Take 400 mg by mouth every 6 (six) hours as needed for fever or mild pain (fever).     Historical Provider, MD  ondansetron (ZOFRAN) 4 MG tablet Take 1 tablet (4 mg total) by mouth every 8 (eight) hours as needed for nausea or vomiting. 09/01/16   Marily MemosJason Dior Stepter, MD    Family History History reviewed. No pertinent family history.  Social History Social History  Substance Use Topics  . Smoking status: Never Smoker  . Smokeless  tobacco: Not on file  . Alcohol use Yes     Comment: occasional     Allergies   Patient has no known allergies.   Review of Systems Review of Systems  All other systems reviewed and are negative.    Physical Exam Updated Vital Signs BP (!) 111/48   Pulse 110   Temp 98.3 F (36.8 C) (Oral)   Resp 16   LMP 08/01/2016   SpO2 100%   Physical Exam  Constitutional: She is oriented to person, place, and time. She appears well-developed and well-nourished.  HENT:  Head: Normocephalic and atraumatic.  Eyes: Conjunctivae and EOM are normal.  Neck: Normal range of motion.  Cardiovascular: Regular rhythm.  Tachycardia present.   Pulmonary/Chest: Effort normal. No stridor. No respiratory distress. She has rales (slight in the right base).  Abdominal: She exhibits no distension.  Musculoskeletal: Normal range of motion. She exhibits no edema or deformity.  Neurological: She is alert and oriented to person, place, and time.  Nursing note and vitals reviewed.    ED Treatments / Results  Labs (all labs ordered are listed, but only abnormal results are displayed) Labs Reviewed  COMPREHENSIVE METABOLIC PANEL - Abnormal; Notable for the following:       Result Value   Calcium 8.8 (*)    Total Protein 8.2 (*)  All other components within normal limits  CBC - Abnormal; Notable for the following:    Hemoglobin 10.9 (*)    HCT 33.7 (*)    MCH 25.4 (*)    All other components within normal limits  URINALYSIS, ROUTINE W REFLEX MICROSCOPIC - Abnormal; Notable for the following:    Hgb urine dipstick SMALL (*)    Nitrite POSITIVE (*)    Leukocytes, UA TRACE (*)    Bacteria, UA MANY (*)    Squamous Epithelial / LPF 0-5 (*)    All other components within normal limits  URINE CULTURE  LIPASE, BLOOD  INFLUENZA PANEL BY PCR (TYPE A & B, H1N1)  POC URINE PREG, ED    EKG  EKG Interpretation None       Radiology Dg Chest 2 View  Result Date: 09/01/2016 CLINICAL DATA:  Cough  and body ache.  Rule out pneumonia EXAM: CHEST  2 VIEW COMPARISON:  12/17/2007 FINDINGS: The heart size and mediastinal contours are within normal limits. Both lungs are clear. The visualized skeletal structures are unremarkable. IMPRESSION: No active cardiopulmonary disease. Electronically Signed   By: Marlan Palau M.D.   On: 09/01/2016 17:50    Procedures Procedures (including critical care time)  Medications Ordered in ED Medications  cefTRIAXone (ROCEPHIN) 1 g in dextrose 5 % 50 mL IVPB (0 g Intravenous Stopped 09/01/16 1914)  sodium chloride 0.9 % bolus 1,000 mL (0 mLs Intravenous Stopped 09/01/16 1915)  acetaminophen (TYLENOL) tablet 650 mg (650 mg Oral Given 09/01/16 1715)  ondansetron (ZOFRAN) injection 4 mg (4 mg Intravenous Given 09/01/16 1731)  benzonatate (TESSALON) capsule 100 mg (100 mg Oral Given 09/01/16 1714)     Initial Impression / Assessment and Plan / ED Course  I have reviewed the triage vital signs and the nursing notes.  Pertinent labs & imaging results that were available during my care of the patient were reviewed by me and considered in my medical decision making (see chart for details).  Clinical Course    Influenza like illness. Also with likely UTI. Will check for flu. Slightly dehydrated and fever likely causing tachycardia so will treat with fluids and tylenol. Cough meds and anti-emetics. Anticipate discharge home.   Improved symptoms. Tolerating PO. Antibiotics and rest of medications administered. Plan for discharge with symptomatic treatment.   Final Clinical Impressions(s) / ED Diagnoses   Final diagnoses:  Cough  Upper respiratory tract infection, unspecified type  Nausea  Urinary tract infection without hematuria, site unspecified    New Prescriptions New Prescriptions   BENZONATATE (TESSALON) 100 MG CAPSULE    Take 1 capsule (100 mg total) by mouth 3 (three) times daily as needed for cough.   CEPHALEXIN (KEFLEX) 500 MG CAPSULE    2 caps po bid x  7 days   ONDANSETRON (ZOFRAN) 4 MG TABLET    Take 1 tablet (4 mg total) by mouth every 8 (eight) hours as needed for nausea or vomiting.     Marily Memos, MD 09/01/16 2003

## 2016-09-01 NOTE — ED Notes (Signed)
Patient POC Urine Preg test was negative.

## 2016-09-01 NOTE — ED Notes (Signed)
Patient verbalizes understanding of discharge instructions, prescriptions, home care and follow up care. Patient out of department at this time. 

## 2016-09-01 NOTE — ED Triage Notes (Addendum)
Pt reports productive cough, body aches, and diarrhea since Saturday. Intermittently keeping fluids down.

## 2016-09-04 LAB — URINE CULTURE: Culture: >=100000 — AB

## 2016-09-05 ENCOUNTER — Telehealth: Payer: Self-pay

## 2016-09-05 NOTE — Telephone Encounter (Signed)
Post ED Visit - Positive Culture Follow-up  Culture report reviewed by antimicrobial stewardship pharmacist:  []  Enzo BiNathan Batchelder, Pharm.D. []  Celedonio MiyamotoJeremy Frens, Pharm.D., BCPS []  Garvin FilaMike Maccia, Pharm.D. []  Georgina PillionElizabeth Martin, Pharm.D., BCPS []  FlorienMinh Pham, [address]Ph[address]D., BCPS, AAHIVP []  Estella HuskMichelle Turner, Pharm.D., BCPS, AAHIVP []  Cassie Stewart, Pharm.D. []  Rob Oswaldo DoneVincent, [address]Ph[address] Casilda Carlsaylor Stone Pharm D Positive urine culture Treated with Cephalexin, organism sensitive to the same and no further patient follow-up is required at this time.  Jerry CarasCullom, Sherian Valenza Burnett 09/05/2016, 9:54 AM

## 2021-10-28 ENCOUNTER — Ambulatory Visit (INDEPENDENT_AMBULATORY_CARE_PROVIDER_SITE_OTHER): Payer: 59 | Admitting: Nurse Practitioner

## 2021-10-28 ENCOUNTER — Encounter: Payer: Self-pay | Admitting: Nurse Practitioner

## 2021-10-28 ENCOUNTER — Other Ambulatory Visit: Payer: Self-pay

## 2021-10-28 VITALS — BP 120/72 | HR 88 | Temp 97.5°F | Ht 59.25 in | Wt 208.4 lb

## 2021-10-28 DIAGNOSIS — D509 Iron deficiency anemia, unspecified: Secondary | ICD-10-CM

## 2021-10-28 DIAGNOSIS — N6452 Nipple discharge: Secondary | ICD-10-CM

## 2021-10-28 DIAGNOSIS — N912 Amenorrhea, unspecified: Secondary | ICD-10-CM | POA: Insufficient documentation

## 2021-10-28 HISTORY — DX: Amenorrhea, unspecified: N91.2

## 2021-10-28 LAB — COMPREHENSIVE METABOLIC PANEL
ALT: 9 U/L (ref 0–35)
AST: 15 U/L (ref 0–37)
Albumin: 4.4 g/dL (ref 3.5–5.2)
Alkaline Phosphatase: 94 U/L (ref 39–117)
BUN: 9 mg/dL (ref 6–23)
CO2: 25 mEq/L (ref 19–32)
Calcium: 9.5 mg/dL (ref 8.4–10.5)
Chloride: 102 mEq/L (ref 96–112)
Creatinine, Ser: 0.75 mg/dL (ref 0.40–1.20)
GFR: 99.46 mL/min (ref >60.00)
Glucose, Bld: 56 mg/dL — ABNORMAL LOW (ref 70–99)
Potassium: 3.9 mEq/L (ref 3.5–5.1)
Sodium: 137 mEq/L (ref 135–145)
Total Bilirubin: 0.5 mg/dL (ref 0.2–1.2)
Total Protein: 8.1 g/dL (ref 6.0–8.3)

## 2021-10-28 LAB — CBC
HCT: 29.7 % — ABNORMAL LOW (ref 36.0–46.0)
Hemoglobin: 9.4 g/dL — ABNORMAL LOW (ref 12.0–15.0)
MCHC: 31.7 g/dL (ref 30.0–36.0)
MCV: 70.4 fl — ABNORMAL LOW (ref 78.0–100.0)
Platelets: 392 10*3/uL (ref 150.0–400.0)
RBC: 4.21 Mil/uL (ref 3.87–5.11)
RDW: 17.2 % — ABNORMAL HIGH (ref 11.5–15.5)
WBC: 7 10*3/uL (ref 4.0–10.5)

## 2021-10-28 LAB — HCG, QUANTITATIVE, PREGNANCY: Quantitative HCG: <0.6 m[IU]/mL

## 2021-10-28 LAB — TSH: TSH: 1.19 u[IU]/mL (ref 0.35–5.50)

## 2021-10-28 NOTE — Progress Notes (Signed)
? ?Subjective:  ?Patient ID: Natalie Orr, female    DOB: 1981-03-18  Age: 41 y.o. MRN: [id number] ? ?CC: Establish Care (New patient/Pt would like to discuss possible pregnancy. Pt missed her period x 2 months but then had a short period for 2 days. ) ? ?HPI ?No previous pcp or GYN in last 28yrs. ? ?Amenorrhea ?Today she reports Amenorrhea and nipple discharge (clear discharge) x 68months. ?Previous menstrual cycle occurred every 28-30days, vaginal bleeding lasted 7days. ?She reports vaginal bleeding 10/05/21-10/07/21 (describes as light). ?She is sexually active with female partner, No condom use, denies need for STD screen ?Last PAP smear >39yrs, No hx of abnormal PAP. ?She has 6children. ?She had inconsistent home pregnancy test in January. ?No pelvic pain, no vaginal discharge, no breast tenderness. ?She report intermittent nausea and generalized fatigue ? ?Check serum HCG, cmp, tsh and prolactin.: normal ?CBC: microcytic anemia. Check iron/ferrritin ?ordered diagnostic mammogram and breast ?Consider progestin challenge if no cycle in 13month ? ?Reviewed past Medical, Social and Family history today. ? ?Outpatient Medications Prior to Visit  ?Medication Sig Dispense Refill  ? acetaminophen (TYLENOL) 500 MG tablet Take 500 mg by mouth every 6 (six) hours as needed for fever (fever). (Patient not taking: Reported on 10/28/2021)    ? benzonatate (TESSALON) 100 MG capsule Take 1 capsule (100 mg total) by mouth 3 (three) times daily as needed for cough. (Patient not taking: Reported on 10/28/2021) 21 capsule 0  ? cephALEXin (KEFLEX) 500 MG capsule 2 caps po bid x 7 days (Patient not taking: Reported on 10/28/2021) 28 capsule 0  ? guaiFENesin (MUCINEX) 600 MG 12 hr tablet Take 600 mg by mouth 2 (two) times daily as needed for cough. (Patient not taking: Reported on 10/28/2021)    ? ibuprofen (ADVIL,MOTRIN) 200 MG tablet Take 400 mg by mouth every 6 (six) hours as needed for fever or mild pain (fever).  (Patient not taking:  Reported on 10/28/2021)    ? ondansetron (ZOFRAN) 4 MG tablet Take 1 tablet (4 mg total) by mouth every 8 (eight) hours as needed for nausea or vomiting. (Patient not taking: Reported on 10/28/2021) 12 tablet 0  ? ?No facility-administered medications prior to visit.  ? ? ?ROS ?See HPI ? ?Objective:  ?BP 120/72 (BP Location: Left Arm, Patient Position: Sitting, Cuff Size: Large)   Pulse 88   Temp (!) 97.5 ?F (36.4 ?C) (Temporal)   Ht 4' 11.25" (1.505 m)   Wt 208 lb 6.4 oz (94.5 kg)   LMP 10/05/2021 (Approximate)   SpO2 100%   BMI 41.74 kg/m?  ? ?Physical Exam ?Constitutional:   ?   Appearance: She is obese.  ?Cardiovascular:  ?   Rate and Rhythm: Normal rate.  ?   Pulses: Normal pulses.  ?Pulmonary:  ?   Effort: Pulmonary effort is normal.  ?Neurological:  ?   Mental Status: She is alert and oriented to person, place, and time.  ? ? ?Assessment & Plan:  ?This visit occurred during the SARS-CoV-2 public health emergency.  Safety protocols were in place, including screening questions prior to the visit, additional usage of staff PPE, and extensive cleaning of exam room while observing appropriate contact time as indicated for disinfecting solutions.  ? ?Natalie Orr was seen today for establish care. ? ?Diagnoses and all orders for this visit: ? ?Amenorrhea ?-     CBC ?-     TSH ?-     B-HCG Quant ?-     Comprehensive metabolic panel ? ?  Bilateral nipple discharge ?-     Prolactin ?-     MM Digital Diagnostic Bilat; Future ?-     US BREAST LTD UNI LEFT INC AXILLA; Future ?-     US BREAST LTD UNI RIGHT INC AXILLA; Future ? ?Microcytic anemia ?-     IBC + Ferritin; Future ? ? ?Problem List Items Addressed This Visit   ? ?  ? Other  ? Amenorrhea - Primary  ?  Today she reports Amenorrhea and nipple discharge (clear discharge) x 58months. ?Previous menstrual cycle occurred every 28-30days, vaginal bleeding lasted 7days. ?She reports vaginal bleeding 10/05/21-10/07/21 (describes as light). ?She is sexually active with female  partner, No condom use, denies need for STD screen ?Last PAP smear >4yrs, No hx of abnormal PAP. ?She has 6children. ?She had inconsistent home pregnancy test in January. ?No pelvic pain, no vaginal discharge, no breast tenderness. ?She report intermittent nausea and generalized fatigue ? ?Check serum HCG, cmp, tsh and prolactin.: normal ?CBC: microcytic anemia. Check iron/ferrritin ?ordered diagnostic mammogram and breast ?Consider progestin challenge if no cycle in 24month ? ?  ?  ? Relevant Orders  ? CBC (Completed)  ? TSH (Completed)  ? B-HCG Quant (Completed)  ? Comprehensive metabolic panel (Completed)  ? Microcytic anemia  ? Relevant Orders  ? IBC + Ferritin  ? ?Other Visit Diagnoses   ? ? Bilateral nipple discharge      ? Relevant Orders  ? Prolactin (Completed)  ? MM Digital Diagnostic Bilat  ? US BREAST LTD UNI LEFT INC AXILLA  ? US BREAST LTD UNI RIGHT INC AXILLA  ? ?  ?  ?Follow-up: Return in about 3 months (around 01/28/2022) for Cpe (fasting). ? ?Natalie Penna, NP ?

## 2021-10-28 NOTE — Assessment & Plan Note (Addendum)
Today she reports Amenorrhea and nipple discharge (clear discharge) x 55months. ?Previous menstrual cycle occurred every 28-30days, vaginal bleeding lasted 7days. ?She reports vaginal bleeding 10/05/21-10/07/21 (describes as light). ?She is sexually active with female partner, No condom use, denies need for STD screen ?Last PAP smear >15yrs, No hx of abnormal PAP. ?She has 6children. ?She had inconsistent home pregnancy test in January. ?No pelvic pain, no vaginal discharge, no breast tenderness. ?She report intermittent nausea and generalized fatigue ? ?Check serum HCG, cmp, tsh and prolactin.: normal ?CBC: microcytic anemia. Check iron/ferrritin ?ordered diagnostic mammogram and breast ?Consider progestin challenge if no cycle in 42month ? ?

## 2021-10-28 NOTE — Patient Instructions (Addendum)
Thank you for choosing [facility] ? ?Go to lab for blood draw ? ?If negative pregnancy test, we will need to complete mammogram and breast US to evaluate nipple drainage ?

## 2021-10-29 ENCOUNTER — Encounter: Payer: Self-pay | Admitting: Nurse Practitioner

## 2021-10-29 DIAGNOSIS — D509 Iron deficiency anemia, unspecified: Secondary | ICD-10-CM | POA: Insufficient documentation

## 2021-10-29 LAB — PROLACTIN: Prolactin: 5.8 ng/mL

## 2021-10-30 ENCOUNTER — Other Ambulatory Visit (INDEPENDENT_AMBULATORY_CARE_PROVIDER_SITE_OTHER): Payer: 59

## 2021-10-30 DIAGNOSIS — D500 Iron deficiency anemia secondary to blood loss (chronic): Secondary | ICD-10-CM

## 2021-10-30 DIAGNOSIS — D509 Iron deficiency anemia, unspecified: Secondary | ICD-10-CM

## 2021-10-30 LAB — IBC + FERRITIN
Ferritin: 5.7 ng/mL — ABNORMAL LOW (ref 10.0–291.0)
Iron: 30 ug/dL — ABNORMAL LOW (ref 42–145)
Saturation Ratios: 6.7 % — ABNORMAL LOW (ref 20.0–50.0)
TIBC: 445.2 ug/dL (ref 250.0–450.0)
Transferrin: 318 mg/dL (ref 212.0–360.0)

## 2021-10-31 MED ORDER — IRON (FERROUS SULFATE) 325 (65 FE) MG PO TABS
325.0000 mg | ORAL_TABLET | Freq: Every day | ORAL | 1 refills | Status: DC
Start: 2021-10-31 — End: 2022-02-03

## 2021-11-16 ENCOUNTER — Telehealth: Payer: 59 | Admitting: Physician Assistant

## 2021-11-16 DIAGNOSIS — K0889 Other specified disorders of teeth and supporting structures: Secondary | ICD-10-CM

## 2021-11-16 DIAGNOSIS — R21 Rash and other nonspecific skin eruption: Secondary | ICD-10-CM

## 2021-11-16 DIAGNOSIS — M25569 Pain in unspecified knee: Secondary | ICD-10-CM

## 2021-11-17 MED ORDER — CLOTRIMAZOLE 1 % EX CREA: 1.0000 "application " | TOPICAL_CREAM | Freq: Two times a day (BID) | CUTANEOUS | 0 refills | Status: DC

## 2021-11-17 MED ORDER — NAPROXEN 500 MG PO TABS: 500.0000 mg | ORAL_TABLET | Freq: Two times a day (BID) | ORAL | 0 refills | Status: AC

## 2021-11-17 MED ORDER — AMOXICILLIN 500 MG PO CAPS: 500.0000 mg | ORAL_CAPSULE | Freq: Three times a day (TID) | ORAL | 0 refills | Status: AC

## 2021-11-17 NOTE — Progress Notes (Signed)
E-Visit for Gout Symptoms ? ?We are sorry that you are not feeling well. We are here to help! ? ?Based on what you shared with me it looks like you may have pain and inflammation in your knee potentially from arthritis. ? ? I have prescribed Naprosyn 500 mg twice daily as needed for mild pain for 7 days ? ? ?HOME CARE ?Keep the leg elevated and use cold compresses for 20 minutes at a time every hour to help reduce swelling and pain.  ?Take prescribed medications as directed  ? ?GET HELP RIGHT AWAY IF: ?Your symptoms persist after you have completed your treatment plan ?You develop severe diarrhea ?You develop abnormal sensations ? You develop vomiting,  ? You develop weakness ? You develop abdominal pain ? ?FOLLOW UP WITH YOUR PRIMARY PROVIDER IF: ?If your symptoms do not improve within 10 days ? ?MAKE SURE YOU  ?Understand these instructions. ?Will watch your condition. ?Will get help right away if you are not doing well or get worse. ? ?Thank you for choosing an e-visit. ? ?Your e-visit answers were reviewed by a board certified advanced clinical practitioner to complete your personal care plan. Depending upon the condition, your plan could have included both over the counter or prescription medications. ? ?Please review your pharmacy choice. Make sure the pharmacy is open so you can pick up prescription now. If there is a problem, you may contact your provider through [employer] and have the prescription routed to another pharmacy.  Your safety is important to [place]. If you have drug allergies check your prescription carefully.  ? ?For the next 24 hours you can use MyChart to ask questions about today's visit, request a non-urgent call back, or ask for a work or school excuse. ?You will get an email in the next two days asking about your experience. I hope that your e-visit has been valuable and will speed your recovery. ? ? ?Approximately 5 minutes was spent documenting and reviewing patient's chart. ? ?

## 2021-11-17 NOTE — Progress Notes (Signed)
E-Visit for Dental Pain  We are sorry that you are not feeling well.  Here is how we plan to help!  Based on what you have shared with me in the questionnaire, it sounds like you have dental pain from a dental infection  Ibuprofen 600mg 3 times a day for 7 days for discomfort and Amoxicillin 500mg 3 times per day for 10 days  It is imperative that you see a dentist within 10 days of this eVisit to determine the cause of the dental pain and be sure it is adequately treated  A toothache or tooth pain is caused when the nerve in the root of a tooth or surrounding a tooth is irritated. Dental (tooth) infection, decay, injury, or loss of a tooth are the most common causes of dental pain. Pain may also occur after an extraction (tooth is pulled out). Pain sometimes originates from other areas and radiates to the jaw, thus appearing to be tooth pain.Bacteria growing inside your mouth can contribute to gum disease and dental decay, both of which can cause pain. A toothache occurs from inflammation of the central portion of the tooth called pulp. The pulp contains nerve endings that are very sensitive to pain. Inflammation to the pulp or pulpitis may be caused by dental cavities, trauma, and infection.    HOME CARE:   For toothaches: Over-the-counter pain medications such as acetaminophen or ibuprofen may be used. Take these as directed on the package while you arrange for a dental appointment. Avoid very cold or hot foods, because they may make the pain worse. You may get relief from biting on a cotton ball soaked in oil of cloves. You can get oil of cloves at most drug stores.  For jaw pain:  Aspirin may be helpful for problems in the joint of the jaw in adults. If pain happens every time you open your mouth widely, the temporomandibular joint (TMJ) may be the source of the pain. Yawning or taking a large bite of food may worsen the pain. An appointment with your doctor or dentist will help you find  the cause.     GET HELP RIGHT AWAY IF:  You have a high fever or chills If you have had a recent head or face injury and develop headache, light headedness, nausea, vomiting, or other symptoms that concern you after an injury to your face or mouth, you could have a more serious injury in addition to your dental injury. A facial rash associated with a toothache: This condition may improve with medication. Contact your doctor for them to decide what is appropriate. Any jaw pain occurring with chest pain: Although jaw pain is most commonly caused by dental disease, it is sometimes referred pain from other areas. People with heart disease, especially people who have had stents placed, people with diabetes, or those who have had heart surgery may have jaw pain as a symptom of heart attack or angina. If your jaw or tooth pain is associated with lightheadedness, sweating, or shortness of breath, you should see a doctor as soon as possible. Trouble swallowing or excessive pain or bleeding from gums: If you have a history of a weakened immune system, diabetes, or steroid use, you may be more susceptible to infections. Infections can often be more severe and extensive or caused by unusual organisms. Dental and gum infections in people with these conditions may require more aggressive treatment. An abscess may need draining or IV antibiotics, for example.  MAKE SURE YOU     Understand these instructions. ?Will watch your condition. ?Will get help right away if you are not doing well or get worse. ? ?Thank you for choosing an e-visit. ? ?Your e-visit answers were reviewed by a board certified advanced clinical practitioner to complete your personal care plan. Depending upon the condition, your plan could have included both over the counter or prescription medications. ? ?Please review your pharmacy choice. Make sure the pharmacy is open so you can pick up prescription now. If there is a problem, you may contact your  provider through [employer] and have the prescription routed to another pharmacy.  Your safety is important to [place]. If you have drug allergies check your prescription carefully.  ? ?For the next 24 hours you can use MyChart to ask questions about today's visit, request a non-urgent call back, or ask for a work or school excuse. ?You will get an email in the next two days asking about your experience. I hope that your e-visit has been valuable and will speed your recovery. ? ?Approximately 5 minutes was spent documenting and reviewing patient's chart. ? ?

## 2021-11-17 NOTE — Progress Notes (Signed)
E-Visit for Athlete's Foot ? ?We are sorry that you are not feeling well. Here is how we plan to help! ? ?Based on what you shared with me it looks like you have tinea pedis, or ?Athlete?s Foot".  This type of rash can spread through shared towels, clothing, bedding, etc., as well as hard surfaces (particularly in moist areas) such as shower stalls, locker room floors, pool areas, etc. The symptoms of Athlete?s Foot include red, swollen, peeling, itchy skin between the toes (especially between the pinky toe and the one next to it). The sole and heel of the foot may also be affected. In severe cases, the skin on the feet can blister. ? ?Athlete?s foot can usually be treated with over-the-counter topical antifungal products; but sometimes with chronic or extensive tinea pedis, prescription oral medications are needed.  ? ?I am recommending:Clotrimazole 1% cream or gel, apply to area twice per day. I have sent this prescription to your pharmacy. ? ?HOME CARE: ? ?Keep feet clean, dry, and cool. ?Avoid using swimming pools, public showers, or foot baths. ?Wear sandals when possible or air shoes out by alternating them every 2-3 days. ?Avoid wearing closed shoes and wearing socks made from fabric that doesn?t dry easily (for example, nylon). ?Treat the infection with recommended medication ? ?GET HELP RIGHT AWAY IF: ? ?Symptoms that don?t go away after treatment. ?Severe itching that persists. ?If your rash spreads or swells. ?If your rash begins to have drainage or smell. ?You develop a fever. ? ?MAKE SURE YOU  ? ?Understand these instructions. ?Will watch your condition. ?Will get help right away if you are not doing well or get worse. ? ? ?Thank you for choosing an e-visit. ? ?Your e-visit answers were reviewed by a board certified advanced clinical practitioner to complete your personal care plan. Depending upon the condition, your plan could have included both over the counter or prescription  medications. ? ?Please review your pharmacy choice. Make sure the pharmacy is open so you can pick up prescription now. If there is a problem, you may contact your provider through [employer] and have the prescription routed to another pharmacy. ? ?Your safety is important to [place]. If you have drug allergies check your prescription carefully.  ? ?For the next 24 hours you can use MyChart to ask questions about today?s visit, request a non-urgent call back, or ask for a work or school excuse. ? ?You will get an email in the next two days asking about your experience. I hope that your e-visit has been valuable and will speed your recovery ? ?References or for more information: ? ?[email or web address]=search_result&selectedTitle=1~104&usage_type=default&display_rank=1 ? ?[email or web address] ? ?Approximately 5 minutes was spent documenting and reviewing patient's chart. ? ? ? ? ? ? ? ?

## 2021-12-08 ENCOUNTER — Telehealth: Payer: 59 | Admitting: Family Medicine

## 2021-12-08 DIAGNOSIS — K0889 Other specified disorders of teeth and supporting structures: Secondary | ICD-10-CM

## 2021-12-09 NOTE — Progress Notes (Signed)
[place] ? ?Recently treated with amoxil on 3/26 for dental pain. ?Needs to have follow up in person to assess. ? ?Message sent on mychart  ?

## 2022-02-03 ENCOUNTER — Ambulatory Visit (INDEPENDENT_AMBULATORY_CARE_PROVIDER_SITE_OTHER): Payer: 59 | Admitting: Nurse Practitioner

## 2022-02-03 ENCOUNTER — Encounter: Payer: Self-pay | Admitting: Nurse Practitioner

## 2022-02-03 ENCOUNTER — Other Ambulatory Visit (HOSPITAL_COMMUNITY)
Admission: RE | Admit: 2022-02-03 | Discharge: 2022-02-03 | Disposition: A | Discharge status: Home / Self Care | Payer: 59 | Source: Ambulatory Visit | Attending: Nurse Practitioner | Admitting: Nurse Practitioner

## 2022-02-03 VITALS — BP 122/78 | HR 89 | Temp 97.5°F | Ht 59.0 in | Wt 204.8 lb

## 2022-02-03 DIAGNOSIS — Z113 Encounter for screening for infections with a predominantly sexual mode of transmission: Secondary | ICD-10-CM | POA: Insufficient documentation

## 2022-02-03 DIAGNOSIS — N921 Excessive and frequent menstruation with irregular cycle: Secondary | ICD-10-CM | POA: Diagnosis not present

## 2022-02-03 DIAGNOSIS — Z13220 Encounter for screening for lipoid disorders: Secondary | ICD-10-CM

## 2022-02-03 DIAGNOSIS — Z136 Encounter for screening for cardiovascular disorders: Secondary | ICD-10-CM

## 2022-02-03 DIAGNOSIS — Z124 Encounter for screening for malignant neoplasm of cervix: Secondary | ICD-10-CM | POA: Insufficient documentation

## 2022-02-03 DIAGNOSIS — Z0001 Encounter for general adult medical examination with abnormal findings: Secondary | ICD-10-CM | POA: Diagnosis not present

## 2022-02-03 DIAGNOSIS — D500 Iron deficiency anemia secondary to blood loss (chronic): Secondary | ICD-10-CM

## 2022-02-03 DIAGNOSIS — A599 Trichomoniasis, unspecified: Secondary | ICD-10-CM

## 2022-02-03 LAB — LIPID PANEL
Cholesterol: 166 mg/dL (ref 0–200)
HDL: 51.2 mg/dL (ref >39.00)
LDL Cholesterol: 102 mg/dL — ABNORMAL HIGH (ref 0–99)
NonHDL: 114.53
Total CHOL/HDL Ratio: 3
Triglycerides: 63 mg/dL (ref 0.0–149.0)
VLDL: 12.6 mg/dL (ref 0.0–40.0)

## 2022-02-03 MED ORDER — IRON (FERROUS SULFATE) 325 (65 FE) MG PO TABS: 325.0000 mg | ORAL_TABLET | Freq: Every day | ORAL | 1 refills | Status: AC

## 2022-02-03 NOTE — Assessment & Plan Note (Addendum)
Repeat iron panel Iron/TIBC/Ferritin/ %Sat    Component Value Date/Time   IRON 11 (L) 02/03/2022 1012   TIBC 385 02/03/2022 1012   FERRITIN 2 (L) 02/03/2022 1012   IRONPCTSAT 3 (L) 02/03/2022 1012      Latest Ref Rng & Units 10/28/2021    1:21 PM 09/01/2016    1:48 PM 06/15/2014    6:39 PM  CBC  WBC 4.0 - 10.5 K/uL 7.0  4.5  7.3   Hemoglobin 12.0 - 15.0 g/dL 9.4  99.2  9.7   Hematocrit 36.0 - 46.0 % 29.7  33.7  29.5   Platelets 150.0 - 400.0 K/uL 392.0  310  340    start iron supplement as discussed. Take with orange juice. Also maintain iron rich diet. Start colace 100mg  BID if you develop constipation. Return to lab for repeat labs if 39month. Consider iron infusion  No improvement

## 2022-02-03 NOTE — Progress Notes (Signed)
Complete physical exam  Patient: Natalie Orr   DOB: 24-Jun-1981   40 y.o. Female  MRN: [id number] Visit Date: 02/03/2022  Subjective:    Chief Complaint  Patient presents with   Annual Exam    CPE Pt fasting Tubal ligation concerns  Pap done today    Natalie Orr is a 41 y.o. female who presents today for a complete physical exam. She reports consuming a general diet.  none  She generally feels well. She reports sleeping well. She does have additional problems to discuss today.  Vision:No Dental:No STD Screen:Yes  Most recent fall risk assessment:     No data to display           Most recent depression screenings:    02/03/2022    9:24 AM 10/28/2021    1:14 PM  PHQ 2/9 Scores  PHQ - 2 Score 0 1  PHQ- 9 Score 1 3    HPI  Iron deficiency anemia due to chronic blood loss Repeat iron panel Start ferrous sulfate 325mg  daily with orange juice  Menorrhagia with irregular cycle Vaginal bleeding 2x/month, last about 3-4days, no clot Dysmenorrhea and palpable mass suprapubic  no dyspareunia S/p tubal ligation 15yrs ago  STD panel check check pelvic 6yr   History reviewed. No pertinent past medical history. Past Surgical History:  Procedure Laterality Date   TUBAL LIGATION     Social History   Socioeconomic History   Marital status: Single    Spouse name: Not on file   Number of children: 6   Years of education: Not on file   Highest education level: Not on file  Occupational History   Not on file  Tobacco Use   Smoking status: Never   Smokeless tobacco: Not on file  Vaping Use   Vaping Use: Never used  Substance and Sexual Activity   Alcohol use: Yes    Comment: occasional   Drug use: No   Sexual activity: Yes    Birth control/protection: Surgical    Comment: s/p tubal ligation  Other Topics Concern   Not on file  Social History Narrative   Not on file   Social Determinants of Health   Financial Resource Strain: Not on file  Food  Insecurity: Not on file  Transportation Needs: Not on file  Physical Activity: Not on file  Stress: Not on file  Social Connections: Not on file  Intimate Partner Violence: Not on file   Family Status  Relation Name Status   Mother  Alive   Father  Alive   MGM  (Not Specified)   Family History  Problem Relation Age of Onset   Diabetes Maternal Grandmother    No Known Allergies  Patient Care Team: Welborn Keena, [place], NP as PCP - General (Internal Medicine)   Medications: Outpatient Medications Prior to Visit  Medication Sig   [DISCONTINUED] clotrimazole (LOTRIMIN) 1 % cream Apply 1 application. topically 2 (two) times daily. (Patient not taking: Reported on 02/03/2022)   [DISCONTINUED] Iron, Ferrous Sulfate, 325 (65 Fe) MG TABS Take 325 mg by mouth daily. (Patient not taking: Reported on 02/03/2022)   No facility-administered medications prior to visit.    Review of Systems  Constitutional:  Negative for fever.  HENT:  Negative for congestion and sore throat.   Eyes:        Negative for visual changes  Respiratory:  Negative for cough and shortness of breath.   Cardiovascular:  Negative for chest pain, palpitations and  leg swelling.  Gastrointestinal:  Negative for blood in stool, constipation and diarrhea.  Genitourinary:  Positive for pelvic pain and vaginal bleeding. Negative for dysuria, frequency, urgency, vaginal discharge and vaginal pain.  Musculoskeletal:  Negative for myalgias.  Skin:  Negative for rash.  Neurological:  Negative for dizziness and headaches.  Hematological:  Does not bruise/bleed easily.  Psychiatric/Behavioral:  Negative for suicidal ideas. The patient is not nervous/anxious.     Last CBC Lab Results  Component Value Date   WBC 7.0 10/28/2021   HGB 9.4 (L) 10/28/2021   HCT 29.7 (L) 10/28/2021   MCV 70.4 (L) 10/28/2021   MCH 25.4 (L) 09/01/2016   RDW 17.2 (H) 10/28/2021   PLT 392.0 10/28/2021   Last metabolic panel Lab Results   Component Value Date   GLUCOSE 56 (L) 10/28/2021   NA 137 10/28/2021   K 3.9 10/28/2021   CL 102 10/28/2021   CO2 25 10/28/2021   BUN 9 10/28/2021   CREATININE 0.75 10/28/2021   GFRNONAA >60 09/01/2016   CALCIUM 9.5 10/28/2021   PROT 8.1 10/28/2021   ALBUMIN 4.4 10/28/2021   BILITOT 0.5 10/28/2021   ALKPHOS 94 10/28/2021   AST 15 10/28/2021   ALT 9 10/28/2021   ANIONGAP 7 09/01/2016      Objective:  BP 122/78 (BP Location: Right Arm, Patient Position: Sitting, Cuff Size: Normal)   Pulse 89   Temp (!) 97.5 F (36.4 C) (Temporal)   Ht 4\' 11"  (1.499 m)   Wt 204 lb 12.8 oz (92.9 kg)   SpO2 96%   BMI 41.36 kg/m     BP Readings from Last 3 Encounters:  02/03/22 122/78  10/28/21 120/72  09/01/16 103/60   Wt Readings from Last 3 Encounters:  02/03/22 204 lb 12.8 oz (92.9 kg)  10/28/21 208 lb 6.4 oz (94.5 kg)  06/15/14 180 lb (81.6 kg)   Physical Exam Vitals reviewed. Exam conducted with a chaperone present.  Constitutional:      General: She is not in acute distress.    Appearance: She is well-developed. She is obese.  HENT:     Right Ear: Tympanic membrane, ear canal and external ear normal.     Left Ear: Tympanic membrane, ear canal and external ear normal.     Nose: Nose normal.  Eyes:     Extraocular Movements: Extraocular movements intact.     Conjunctiva/sclera: Conjunctivae normal.  Cardiovascular:     Rate and Rhythm: Normal rate and regular rhythm.     Pulses: Normal pulses.     Heart sounds: Normal heart sounds.  Pulmonary:     Effort: Pulmonary effort is normal. No respiratory distress.     Breath sounds: Normal breath sounds.  Chest:     Chest wall: No tenderness.  Breasts:    Breasts are symmetrical.     Right: Normal.     Left: Normal.  Abdominal:     General: Bowel sounds are normal.     Palpations: Abdomen is soft.     Hernia: There is no hernia in the left inguinal area or right inguinal area.  Genitourinary:    General: Normal vulva.      Exam position: Lithotomy position.     Labia:        Right: No rash or tenderness.        Left: No rash or tenderness.      Vagina: Normal.     Cervix: Normal.     Uterus: Normal. Enlarged.  Not tender.      Adnexa: Right adnexa normal and left adnexa normal.  Musculoskeletal:        General: Normal range of motion.     Right lower leg: No edema.     Left lower leg: No edema.  Lymphadenopathy:     Upper Body:     Right upper body: No supraclavicular, axillary or pectoral adenopathy.     Left upper body: No supraclavicular, axillary or pectoral adenopathy.     Lower Body: No right inguinal adenopathy. No left inguinal adenopathy.  Skin:    General: Skin is warm and dry.  Neurological:     Mental Status: She is alert and oriented to person, place, and time.     Deep Tendon Reflexes: Reflexes are normal and symmetric.  Psychiatric:        Mood and Affect: Mood normal.        Behavior: Behavior normal.        Thought Content: Thought content normal.     Results for orders placed or performed in visit on 02/03/22  Lipid panel  Result Value Ref Range   Cholesterol 166 0 - 200 mg/dL   Triglycerides 78.2 0.0 - 149.0 mg/dL   HDL 95.62 >13.08 mg/dL   VLDL 65.7 0.0 - 84.6 mg/dL   LDL Cholesterol 962 (H) 0 - 99 mg/dL   Total CHOL/HDL Ratio 3    NonHDL 114.53       Assessment & Plan:    Routine Health Maintenance and Physical Exam  Immunization History  Administered Date(s) Administered   PPD Test 09/10/2021   Health Maintenance  Topic Date Due   HIV Screening  Never done   Hepatitis C Screening  Never done   PAP SMEAR-Modifier  Never done   COVID-19 Vaccine (1) 02/19/2022 (Originally 09/24/1981)   TETANUS/TDAP  10/29/2022 (Originally 03/24/2000)   INFLUENZA VACCINE  03/24/2022   HPV VACCINES  Aged Out   Discussed health benefits of physical activity, and encouraged her to engage in regular exercise appropriate for her age and condition.  Problem List Items Addressed  This Visit       Other   Iron deficiency anemia due to chronic blood loss    Repeat iron panel Start ferrous sulfate  daily with orange juice      Relevant Medications   Iron, Ferrous Sulfate, 325 (65 Fe) MG TABS   Other Relevant Orders   US Pelvic Complete With Transvaginal   Iron, TIBC and Ferritin Panel   Menorrhagia with irregular cycle    Vaginal bleeding 2x/month, last about 3-4days, no clot Dysmenorrhea and palpable mass suprapubic  no dyspareunia S/p tubal ligation 62yrs ago  STD panel check check pelvic US       Relevant Orders   US Pelvic Complete With Transvaginal   Other Visit Diagnoses     Encounter for preventative adult health care exam with abnormal findings    -  Primary   Relevant Orders   Lipid panel (Completed)   Cytology - PAP   Encounter for lipid screening for cardiovascular disease       Relevant Orders   Lipid panel (Completed)   Encounter for Papanicolaou smear for cervical cancer screening       Relevant Orders   Cytology - PAP   Screen for STD (sexually transmitted disease)       Relevant Orders   HIV Antibody (routine testing w rflx)   RPR   Hepatitis C antibody  Cervicovaginal ancillary only      Return in about 3 months (around 05/06/2022) for Anemia.     Alysia Penna, NP

## 2022-02-03 NOTE — Patient Instructions (Signed)
Go to lab You will be contacted to schedule appt for pelvic US Start iron supplement 1tab daily, take with orange juice.  Preventive Care 34-41 Years Old, Female Preventive care refers to lifestyle choices and visits with your health care provider that can promote health and wellness. Preventive care visits are also called wellness exams. What can I expect for my preventive care visit? Counseling Your health care provider may ask you questions about your: Medical history, including: Past medical problems. Family medical history. Pregnancy history. Current health, including: Menstrual cycle. Method of birth control. Emotional well-being. Home life and relationship well-being. Sexual activity and sexual health. Lifestyle, including: Alcohol, nicotine or tobacco, and drug use. Access to firearms. Diet, exercise, and sleep habits. Work and work Statistician. Sunscreen use. Safety issues such as seatbelt and bike helmet use. Physical exam Your health care provider will check your: Height and weight. These may be used to calculate your BMI (body mass index). BMI is a measurement that tells if you are at a healthy weight. Waist circumference. This measures the distance around your waistline. This measurement also tells if you are at a healthy weight and may help predict your risk of certain diseases, such as type 2 diabetes and high blood pressure. Heart rate and blood pressure. Body temperature. Skin for abnormal spots. What immunizations do I need?  Vaccines are usually given at various ages, according to a schedule. Your health care provider will recommend vaccines for you based on your age, medical history, and lifestyle or other factors, such as travel or where you work. What tests do I need? Screening Your health care provider may recommend screening tests for certain conditions. This may include: Lipid and cholesterol levels. Diabetes screening. This is done by checking your  blood sugar (glucose) after you have not eaten for a while (fasting). Pelvic exam and Pap test. Hepatitis B test. Hepatitis C test. HIV (human immunodeficiency virus) test. STI (sexually transmitted infection) testing, if you are at risk. Lung cancer screening. Colorectal cancer screening. Mammogram. Talk with your health care provider about when you should start having regular mammograms. This may depend on whether you have a family history of breast cancer. BRCA-related cancer screening. This may be done if you have a family history of breast, ovarian, tubal, or peritoneal cancers. Bone density scan. This is done to screen for osteoporosis. Talk with your health care provider about your test results, treatment options, and if necessary, the need for more tests. Follow these instructions at home: Eating and drinking  Eat a diet that includes fresh fruits and vegetables, whole grains, lean protein, and low-fat dairy products. Take vitamin and mineral supplements as recommended by your health care provider. Do not drink alcohol if: Your health care provider tells you not to drink. You are pregnant, may be pregnant, or are planning to become pregnant. If you drink alcohol: Limit how much you have to 0-1 drink a day. Know how much alcohol is in your drink. In the [place] one drink equals one 12 oz bottle of beer (355 mL), one 5 oz glass of wine (148 mL), or one 1 oz glass of hard liquor (44 mL). Lifestyle Brush your teeth every morning and night with fluoride toothpaste. Floss one time each day. Exercise for at least 30 minutes 5 or more days each week. Do not use any products that contain nicotine or tobacco. These products include cigarettes, chewing tobacco, and vaping devices, such as e-cigarettes. If you need help quitting, ask your health  care provider. Do not use drugs. If you are sexually active, practice safe sex. Use a condom or other form of protection to prevent STIs. If you do  not wish to become pregnant, use a form of birth control. If you plan to become pregnant, see your health care provider for a prepregnancy visit. Take aspirin only as told by your health care provider. Make sure that you understand how much to take and what form to take. Work with your health care provider to find out whether it is safe and beneficial for you to take aspirin daily. Find healthy ways to manage stress, such as: Meditation, yoga, or listening to music. Journaling. Talking to a trusted person. Spending time with friends and family. Minimize exposure to UV radiation to reduce your risk of skin cancer. Safety Always wear your seat belt while driving or riding in a vehicle. Do not drive: If you have been drinking alcohol. Do not ride with someone who has been drinking. When you are tired or distracted. While texting. If you have been using any mind-altering substances or drugs. Wear a helmet and other protective equipment during sports activities. If you have firearms in your house, make sure you follow all gun safety procedures. Seek help if you have been physically or sexually abused. What's next? Visit your health care provider once a year for an annual wellness visit. Ask your health care provider how often you should have your eyes and teeth checked. Stay up to date on all vaccines. This information is not intended to replace advice given to you by your health care provider. Make sure you discuss any questions you have with your health care provider. Document Revised: 02/05/2021 Document Reviewed: 02/05/2021 Elsevier Patient Education  [place].

## 2022-02-03 NOTE — Assessment & Plan Note (Addendum)
Vaginal bleeding 2x/month, last about 3-4days, no clot Dysmenorrhea and palpable mass suprapubic  no dyspareunia S/p tubal ligation 29yrs ago  STD panel check check pelvic US

## 2022-02-04 ENCOUNTER — Encounter: Payer: Self-pay | Admitting: Nurse Practitioner

## 2022-02-04 LAB — IRON,TIBC AND FERRITIN PANEL
%SAT: 3 % (calc) — ABNORMAL LOW (ref 16–45)
Ferritin: 2 ng/mL — ABNORMAL LOW (ref 16–154)
Iron: 11 ug/dL — ABNORMAL LOW (ref 40–190)
TIBC: 385 mcg/dL (calc) (ref 250–450)

## 2022-02-04 LAB — CYTOLOGY - PAP
Comment: NEGATIVE
Diagnosis: NEGATIVE
High risk HPV: NEGATIVE

## 2022-02-04 LAB — HEPATITIS C ANTIBODY
Hepatitis C Ab: NONREACTIVE
SIGNAL TO CUT-OFF: 0.11 (ref <1.00)

## 2022-02-04 LAB — CERVICOVAGINAL ANCILLARY ONLY
Bacterial Vaginitis (gardnerella): POSITIVE — AB
Candida Glabrata: NEGATIVE
Candida Vaginitis: NEGATIVE
Chlamydia: NEGATIVE
Comment: NEGATIVE
Comment: NEGATIVE
Comment: NEGATIVE
Comment: NEGATIVE
Comment: NEGATIVE
Comment: NORMAL
Neisseria Gonorrhea: NEGATIVE
Trichomonas: POSITIVE — AB

## 2022-02-04 LAB — RPR: RPR Ser Ql: NONREACTIVE

## 2022-02-04 LAB — HIV ANTIBODY (ROUTINE TESTING W REFLEX): HIV 1&2 Ab, 4th Generation: NONREACTIVE

## 2022-02-05 ENCOUNTER — Other Ambulatory Visit: Payer: Self-pay | Admitting: Nurse Practitioner

## 2022-02-05 DIAGNOSIS — A599 Trichomoniasis, unspecified: Secondary | ICD-10-CM

## 2022-02-05 MED ORDER — METRONIDAZOLE 500 MG PO TABS: 2000.0000 mg | ORAL_TABLET | Freq: Once | ORAL | 0 refills | Status: DC

## 2022-02-05 NOTE — Addendum Note (Signed)
Addended by: Alysia Penna L on: 02/05/2022 12:59 PM   Modules accepted: Orders

## 2022-02-06 MED ORDER — METRONIDAZOLE 500 MG PO TABS: 2000.0000 mg | ORAL_TABLET | Freq: Once | ORAL | 0 refills | Status: AC

## 2022-05-06 ENCOUNTER — Ambulatory Visit: Payer: Self-pay | Admitting: Nurse Practitioner

## 2022-05-06 ENCOUNTER — Telehealth: Payer: Self-pay | Admitting: Nurse Practitioner

## 2022-05-06 NOTE — Telephone Encounter (Signed)
Pt was a no show for an OV with [place] on 05/06/22, I sent a letter.

## 2022-05-06 NOTE — Telephone Encounter (Signed)
1st no show, fee waived, letter sent requesting patient to reschedule

## 2022-07-12 ENCOUNTER — Telehealth: Payer: Self-pay | Admitting: Family

## 2022-07-12 DIAGNOSIS — J069 Acute upper respiratory infection, unspecified: Secondary | ICD-10-CM

## 2022-07-12 DIAGNOSIS — R399 Unspecified symptoms and signs involving the genitourinary system: Secondary | ICD-10-CM

## 2022-07-12 MED ORDER — BENZONATATE 100 MG PO CAPS: 100.0000 mg | ORAL_CAPSULE | Freq: Three times a day (TID) | ORAL | 0 refills | Status: DC | PRN

## 2022-07-12 MED ORDER — CEPHALEXIN 500 MG PO CAPS: 500.0000 mg | ORAL_CAPSULE | Freq: Two times a day (BID) | ORAL | 0 refills | Status: DC

## 2022-07-12 MED ORDER — FLUTICASONE PROPIONATE 50 MCG/ACT NA SUSP: 2.0000 | Freq: Every day | NASAL | 6 refills | Status: DC

## 2022-07-12 NOTE — Progress Notes (Signed)

## 2022-07-12 NOTE — Progress Notes (Signed)

## 2022-07-24 ENCOUNTER — Telehealth: Payer: Self-pay | Admitting: Emergency Medicine

## 2022-07-24 DIAGNOSIS — K0889 Other specified disorders of teeth and supporting structures: Secondary | ICD-10-CM

## 2022-07-24 MED ORDER — AMOXICILLIN 500 MG PO CAPS: 500.0000 mg | ORAL_CAPSULE | Freq: Three times a day (TID) | ORAL | 0 refills | Status: AC

## 2022-07-24 MED ORDER — IBUPROFEN 600 MG PO TABS: 600.0000 mg | ORAL_TABLET | Freq: Three times a day (TID) | ORAL | 0 refills | Status: DC | PRN

## 2022-07-24 NOTE — Progress Notes (Signed)
E-Visit for Dental Pain  We are sorry that you are not feeling well.  Here is how we plan to help!  Based on what you have shared with me in the questionnaire, it sounds like you have a dental infection  Ibuprofen 600mg  3 times a day for 7 days for discomfort and Amoxicillin 500mg  3 times per day for 10 days  It is imperative that you see a dentist within 10 days of this eVisit to determine the cause of the dental pain and be sure it is adequately treated  Look up the [facility]'s Missions [facility] for free dental clinics. [facility].asp  Get there early and be prepared to wait. [facility] and [employer] have [email or web address].williams-garcia.biz/ schools that provide low cost routine dental care.   Other resources: [facility] [address] [place], [place] [phone]  Patients with Medicaid: [facility] [phone]                                [phone] [address] Phone:  [phone]                                                  Phone:  [phone]  Dr. [employer] [address] [phone]  [facility] [phone] extension [phone] [address]   [place] [phone] [address] [place].  Rescue mission [phone] extension [phone], [place], [address], [place] First come first serve for the first 10 clients.  May do simple extractions only, no wisdom teeth or surgery.  You may try the second for Thursday of the month starting at 6:30 AM.  [facility] of Dentistry You may call the school to see if they are still helping to provide dental care for emergent cases.  If unable to pay or uninsured, contact: [facility]. to become qualified for the adult dental clinic.   No matter what dental problem you have, it will not get better unless you get good dental care.  If the tooth is not taken care of, your symptoms will  come back in time and you will be visiting [facility] again in the Urgent Care Center with a bad toothache.  So, see your dentist as soon as possible.  If you don't have a dentist, we can give you a list of dentists.  Sometimes the most cost effective treatment is removal of the tooth.  This can be done very inexpensively through one of the low cost [facility] such as the facility on [facility] in [place] [phone]).  The downside to this is that you will have one less tooth and this can effect your ability to chew.  Some other things that can be done for a dental infection include the following:  Rinse your mouth out with hot salt water (1/2 tsp of table salt and a pinch of baking soda in 8 oz of hot water).  You can do this every 2 or 3 hours. Avoid cold foods, beverages, and cold air.  This will make your symptoms worse. Sleep with your head elevated.  Sleeping flat will cause your gums and oral tissues to swell and make them hurt more.  You can sleep on several pillows.  Even better is to sleep in a recliner with your head higher than your heart. For mild to moderate pain, you can take Tylenol, ibuprofen, or Aleve. External application of heat by a heating pad, hot water bottle, or hot wet towel can help with pain and speed healing.  You can do this every 2 to 3 hours. Do not fall asleep on a heating pad since this can cause a burn.   Go to www.goodrx.com to look up your medications. This will give you a list of where you can find your prescriptions at the most affordable prices. Or ask the pharmacist what the cash price is, or if they have any other discount programs available to help make your medication more affordable. This can be less expensive than what you would pay with insurance.     A toothache or tooth pain is caused when the nerve in the root of a tooth or surrounding a tooth is irritated. Dental (tooth) infection, decay, injury, or loss of a tooth are the most common  causes of dental pain. Pain may also occur after an extraction (tooth is pulled out). Pain sometimes originates from other areas and radiates to the jaw, thus appearing to be tooth pain.Bacteria growing inside your mouth can contribute to gum disease and dental decay, both of which can cause pain. A toothache occurs from inflammation of the central portion of the tooth called pulp. The pulp contains nerve endings that are very sensitive to pain. Inflammation to the pulp or pulpitis may be caused by dental cavities, trauma, and infection.    HOME CARE:   For toothaches: Over-the-counter pain medications such as acetaminophen or ibuprofen may be used. Take these as directed on the package while you arrange for a dental appointment. Avoid very cold or hot foods, because they may make the pain worse. You may get relief from biting on a cotton ball soaked in oil of cloves. You can get oil of cloves at most drug stores.  For jaw pain:  Aspirin may be helpful for problems in the joint of the jaw in adults. If pain happens every time you open your mouth widely, the temporomandibular joint (TMJ) may be the source of the pain. Yawning or taking a large bite of food may worsen the pain. An appointment with your doctor or dentist will help you find the cause.     GET HELP RIGHT AWAY IF:  You have a high fever or chills If you have had a recent head or face injury and develop headache, light headedness, nausea, vomiting, or other symptoms that concern you after an injury to your face or mouth, you could have a more serious injury in addition to your dental injury. A facial rash associated with a toothache: This condition may improve with medication. Contact your doctor for them to decide what is appropriate. Any jaw pain occurring with chest pain: Although jaw pain is most commonly caused by dental disease, it is sometimes referred pain from other areas. People with heart disease, especially people who have  had stents placed, people with diabetes, or those who have had heart surgery may have jaw pain as a symptom of heart attack or angina. If your jaw or tooth pain is associated with lightheadedness, sweating, or shortness of breath, you should see a doctor as soon as possible. Trouble swallowing or  excessive pain or bleeding from gums: If you have a history of a weakened immune system, diabetes, or steroid use, you may be more susceptible to infections. Infections can often be more severe and extensive or caused by unusual organisms. Dental and gum infections in people with these conditions may require more aggressive treatment. An abscess may need draining or IV antibiotics, for example.  MAKE SURE YOU   Understand these instructions. Will watch your condition. Will get help right away if you are not doing well or get worse.  Thank you for choosing an e-visit.  Your e-visit answers were reviewed by a board certified advanced clinical practitioner to complete your personal care plan. Depending upon the condition, your plan could have included both over the counter or prescription medications.  Please review your pharmacy choice. Make sure the pharmacy is open so you can pick up prescription now. If there is a problem, you may contact your provider through [employer] and have the prescription routed to another pharmacy.  Your safety is important to [place]. If you have drug allergies check your prescription carefully.   For the next 24 hours you can use MyChart to ask questions about today's visit, request a non-urgent call back, or ask for a work or school excuse. You will get an email in the next two days asking about your experience. I hope that your e-visit has been valuable and will speed your recovery.  I have spent 5 minutes in review of e-visit questionnaire, review and updating patient chart, medical decision making and response to patient.   Rica Mast, PhD, FNP-BC

## 2022-08-14 ENCOUNTER — Telehealth: Payer: Self-pay | Admitting: Emergency Medicine

## 2022-08-14 DIAGNOSIS — M546 Pain in thoracic spine: Secondary | ICD-10-CM

## 2022-08-14 MED ORDER — CYCLOBENZAPRINE HCL 5 MG PO TABS: 5.0000 mg | ORAL_TABLET | Freq: Three times a day (TID) | ORAL | 0 refills | Status: DC | PRN

## 2022-08-14 MED ORDER — NAPROXEN 500 MG PO TABS: 500.0000 mg | ORAL_TABLET | Freq: Two times a day (BID) | ORAL | 0 refills | Status: AC

## 2022-08-14 NOTE — Progress Notes (Signed)
We are sorry that you are not feeling well.  Here is how we plan to help!  Based on what you have shared with me it looks like you mostly have acute back pain.   Acute back pain is defined as musculoskeletal pain that can resolve in 1-3 weeks with conservative treatment.  I have prescribed Naprosyn 500 mg take one by mouth twice a day non-steroid anti-inflammatory (NSAID) as well as Flexeril 5 mg every eight hours as needed which is a muscle relaxer.  Some patients experience stomach irritation or in increased heartburn with anti-inflammatory drugs.  Please keep in mind that muscle relaxer's can cause fatigue and should not be taken while at work or driving.  Back pain is very common.  The pain often gets better over time.  The cause of back pain is usually not dangerous.  Most people can learn to manage their back pain on their own.  Home Care Stay active.  Start with short walks on flat ground if you can.  Try to walk farther each day. Do not sit, drive or stand in one place for more than 30 minutes.  Do not stay in bed. Do not avoid exercise or work.  Activity can help your back heal faster. Be careful when you bend or lift an object.  Bend at your knees, keep the object close to you, and do not twist. Sleep on a firm mattress.  Lie on your side, and bend your knees.  If you lie on your back, put a pillow under your knees. Only take medicines as told by your doctor. Put ice on the injured area. Put ice in a plastic bag Place a towel between your skin and the bag Leave the ice on for 15-20 minutes, 3-4 times a day for the first 2-3 days. 210 After that, you can switch between ice and heat packs. Ask your doctor about back exercises or massage. Avoid feeling anxious or stressed.  Find good ways to deal with stress, such as exercise.  Get Help Right Way If: Your pain does not go away with rest or medicine. Your pain does not go away in 1 week. You have new problems. You do not feel  well. The pain spreads into your legs. You cannot control when you poop (bowel movement) or pee (urinate) You feel sick to your stomach (nauseous) or throw up (vomit) You have belly (abdominal) pain. You feel like you may pass out (faint). If you develop a fever.  Make Sure you: Understand these instructions. Will watch your condition Will get help right away if you are not doing well or get worse.  Your e-visit answers were reviewed by a board certified advanced clinical practitioner to complete your personal care plan.  Depending on the condition, your plan could have included both over the counter or prescription medications.  If there is a problem please reply  once you have received a response from your provider.  Your safety is important to [place].  If you have drug allergies check your prescription carefully.    You can use MyChart to ask questions about today's visit, request a non-urgent call back, or ask for a work or school excuse for 24 hours related to this e-Visit. If it has been greater than 24 hours you will need to follow up with your provider, or enter a new e-Visit to address those concerns.  You will get an e-mail in the next two days asking about your experience.  I hope  that your e-visit has been valuable and will speed your recovery. Thank you for using e-visits.  I have spent 5 minutes in review of e-visit questionnaire, review and updating patient chart, medical decision making and response to patient.   Rica Mast, PhD, FNP-BC

## 2022-09-22 ENCOUNTER — Telehealth: Payer: Self-pay | Admitting: Nurse Practitioner

## 2022-09-22 ENCOUNTER — Telehealth: Payer: Self-pay | Admitting: Family Medicine

## 2022-09-22 DIAGNOSIS — K0889 Other specified disorders of teeth and supporting structures: Secondary | ICD-10-CM

## 2022-09-22 DIAGNOSIS — B3731 Acute candidiasis of vulva and vagina: Secondary | ICD-10-CM

## 2022-09-22 MED ORDER — FLUCONAZOLE 150 MG PO TABS: 150.0000 mg | ORAL_TABLET | Freq: Once | ORAL | 0 refills | Status: AC

## 2022-09-22 NOTE — Progress Notes (Signed)
Because recent dental infection in Dec 2023- that was treated by [place]- you should be seen for on going concerns for this, I feel your condition warrants further evaluation and I recommend that you be seen in a face to face visit.   NOTE: There will be NO CHARGE for this eVisit

## 2022-09-22 NOTE — Progress Notes (Signed)
Natalie Orr, If you feel that you are also suffering a urinary tract infection I need you to go back to the menu and choose UTI as the reason for visit as that populates different essential questions in order for [place] to treat a UTI. If you resubmit I will not charge you for two visits, but we need that form completed before we can treat a UTI- below you will find the treatment plan for your yeast infection.   E-Visit for Vaginal Symptoms  We are sorry that you are not feeling well. Here is how we plan to help! Based on what you shared with me it looks like you: May have a yeast vaginosis  Vaginosis is an inflammation of the vagina that can result in discharge, itching and pain. The cause is usually a change in the normal balance of vaginal bacteria or an infection. Vaginosis can also result from reduced estrogen levels after menopause.  The most common causes of vaginosis are:   Bacterial vaginosis which results from an overgrowth of one on several organisms that are normally present in your vagina.   Yeast infections which are caused by a naturally occurring fungus called candida.   Vaginal atrophy (atrophic vaginosis) which results from the thinning of the vagina from reduced estrogen levels after menopause.   Trichomoniasis which is caused by a parasite and is commonly transmitted by sexual intercourse.  Factors that increase your risk of developing vaginosis include: Medications, such as antibiotics and steroids Uncontrolled diabetes Use of hygiene products such as bubble bath, vaginal spray or vaginal deodorant Douching Wearing damp or tight-fitting clothing Using an intrauterine device (IUD) for birth control Hormonal changes, such as those associated with pregnancy, birth control pills or menopause Sexual activity Having a sexually transmitted infection  Your treatment plan is A single Diflucan (fluconazole) 150mg  tablet once.  I have electronically sent this prescription into the  pharmacy that you have chosen.  Be sure to take all of the medication as directed. Stop taking any medication if you develop a rash, tongue swelling or shortness of breath. Mothers who are breast feeding should consider pumping and discarding their breast milk while on these antibiotics. However, there is no consensus that infant exposure at these doses would be harmful.  Remember that medication creams can weaken latex condoms. Marland Kitchen   HOME CARE:  Good hygiene may prevent some types of vaginosis from recurring and may relieve some symptoms:  Avoid baths, hot tubs and whirlpool spas. Rinse soap from your outer genital area after a shower, and dry the area well to prevent irritation. Don't use scented or harsh soaps, such as those with deodorant or antibacterial action. Avoid irritants. These include scented tampons and pads. Wipe from front to back after using the toilet. Doing so avoids spreading fecal bacteria to your vagina.  Other things that may help prevent vaginosis include:  Don't douche. Your vagina doesn't require cleansing other than normal bathing. Repetitive douching disrupts the normal organisms that reside in the vagina and can actually increase your risk of vaginal infection. Douching won't clear up a vaginal infection. Use a latex condom. Both female and female latex condoms may help you avoid infections spread by sexual contact. Wear cotton underwear. Also wear pantyhose with a cotton crotch. If you feel comfortable without it, skip wearing underwear to bed. Yeast thrives in [employer] Your symptoms should improve in the next day or two.  GET HELP RIGHT AWAY IF:  You have pain in your lower abdomen (  pelvic area or over your ovaries) You develop nausea or vomiting You develop a fever Your discharge changes or worsens You have persistent pain with intercourse You develop shortness of breath, a rapid pulse, or you faint.  These symptoms could be signs of problems or  infections that need to be evaluated by a medical provider now.  MAKE SURE YOU   Understand these instructions. Will watch your condition. Will get help right away if you are not doing well or get worse.  Thank you for choosing an e-visit.  Your e-visit answers were reviewed by a board certified advanced clinical practitioner to complete your personal care plan. Depending upon the condition, your plan could have included both over the counter or prescription medications.  Please review your pharmacy choice. Make sure the pharmacy is open so you can pick up prescription now. If there is a problem, you may contact your provider through [employer] and have the prescription routed to another pharmacy.  Your safety is important to [place]. If you have drug allergies check your prescription carefully.   For the next 24 hours you can use MyChart to ask questions about today's visit, request a non-urgent call back, or ask for a work or school excuse. You will get an email in the next two days asking about your experience. I hope that your e-visit has been valuable and will speed your recovery.   Meds ordered this encounter  Medications   fluconazole (DIFLUCAN) 150 MG tablet    Sig: Take 1 tablet (150 mg total) by mouth once for 1 dose.    Dispense:  1 tablet    Refill:  0    I spent approximately 5 minutes reviewing the patient's history, current symptoms and coordinating their care today.

## 2022-10-11 ENCOUNTER — Telehealth: Payer: Self-pay | Admitting: Family

## 2022-10-11 DIAGNOSIS — K0889 Other specified disorders of teeth and supporting structures: Secondary | ICD-10-CM

## 2022-10-11 MED ORDER — AMOXICILLIN-POT CLAVULANATE 875-125 MG PO TABS: 1.0000 | ORAL_TABLET | Freq: Two times a day (BID) | ORAL | 0 refills | Status: DC

## 2022-10-11 NOTE — Progress Notes (Signed)

## 2022-10-26 ENCOUNTER — Telehealth: Payer: Self-pay | Admitting: Physician Assistant

## 2022-10-26 DIAGNOSIS — B9789 Other viral agents as the cause of diseases classified elsewhere: Secondary | ICD-10-CM

## 2022-10-26 DIAGNOSIS — J019 Acute sinusitis, unspecified: Secondary | ICD-10-CM

## 2022-10-27 ENCOUNTER — Encounter: Payer: Self-pay | Admitting: Nurse Practitioner

## 2022-10-27 MED ORDER — FLUTICASONE PROPIONATE 50 MCG/ACT NA SUSP: 2.0000 | Freq: Every day | NASAL | 0 refills | Status: DC

## 2022-10-27 NOTE — Progress Notes (Signed)
E-Visit for Sinus Problems  We are sorry that you are not feeling well.  Here is how we plan to help!  Based on what you have shared with me it looks like you have sinusitis.  Sinusitis is inflammation and infection in the sinus cavities of the head.  Based on your presentation I believe you most likely have Acute Viral Sinusitis.This is an infection most likely caused by a virus. There is not specific treatment for viral sinusitis other than to help you with the symptoms until the infection runs its course.  You may use an oral decongestant such as Mucinex D or if you have glaucoma or high blood pressure use plain Mucinex. Saline nasal spray help and can safely be used as often as needed for congestion, I have prescribed: Fluticasone nasal spray two sprays in each nostril once a day  Some authorities believe that zinc sprays or the use of Echinacea may shorten the course of your symptoms.  Sinus infections are not as easily transmitted as other respiratory infection, however we still recommend that you avoid close contact with loved ones, especially the very young and elderly.  Remember to wash your hands thoroughly throughout the day as this is the number one way to prevent the spread of infection!  Home Care: Only take medications as instructed by your medical team. Do not take these medications with alcohol. A steam or ultrasonic humidifier can help congestion.  You can place a towel over your head and breathe in the steam from hot water coming from a faucet. Avoid close contacts especially the very young and the elderly. Cover your mouth when you cough or sneeze. Always remember to wash your hands.  Get Help Right Away If: You develop worsening fever or sinus pain. You develop a severe head ache or visual changes. Your symptoms persist after you have completed your treatment plan.  Make sure you Understand these instructions. Will watch your condition. Will get help right away if you  are not doing well or get worse.   Thank you for choosing an e-visit.  Your e-visit answers were reviewed by a board certified advanced clinical practitioner to complete your personal care plan. Depending upon the condition, your plan could have included both over the counter or prescription medications.  Please review your pharmacy choice. Make sure the pharmacy is open so you can pick up prescription now. If there is a problem, you may contact your provider through MyChart messaging and have the prescription routed to another pharmacy.  Your safety is important to us. If you have drug allergies check your prescription carefully.   For the next 24 hours you can use MyChart to ask questions about today's visit, request a non-urgent call back, or ask for a work or school excuse. You will get an email in the next two days asking about your experience. I hope that your e-visit has been valuable and will speed your recovery.   

## 2022-10-27 NOTE — Progress Notes (Signed)
I have spent 5 minutes in review of e-visit questionnaire, review and updating patient chart, medical decision making and response to patient.   Kelleen Stolze Cody Lian Tanori, PA-C    

## 2022-11-17 ENCOUNTER — Telehealth: Payer: Self-pay | Admitting: Nurse Practitioner

## 2022-11-17 ENCOUNTER — Telehealth: Payer: Self-pay | Admitting: Physician Assistant

## 2022-11-17 DIAGNOSIS — G8929 Other chronic pain: Secondary | ICD-10-CM

## 2022-11-17 DIAGNOSIS — K047 Periapical abscess without sinus: Secondary | ICD-10-CM

## 2022-11-17 DIAGNOSIS — M545 Low back pain, unspecified: Secondary | ICD-10-CM

## 2022-11-17 DIAGNOSIS — K089 Disorder of teeth and supporting structures, unspecified: Secondary | ICD-10-CM

## 2022-11-17 MED ORDER — AMOXICILLIN-POT CLAVULANATE 875-125 MG PO TABS: 1.0000 | ORAL_TABLET | Freq: Two times a day (BID) | ORAL | 0 refills | Status: DC

## 2022-11-17 MED ORDER — MELOXICAM 15 MG PO TABS: 15.0000 mg | ORAL_TABLET | Freq: Every day | ORAL | 0 refills | Status: DC

## 2022-11-17 NOTE — Progress Notes (Signed)
Natalie Orr,  Please contact your primary care or seek care at an Urgent care today for your back pain- Once we have seen you for back pain and it recurs we always recommend in person evaluation.   Thank you Natalie Orr    I feel your condition warrants further evaluation and I recommend that you be seen for a face to face visit.  Please contact your primary care physician practice to be seen. Many offices offer virtual options to be seen via video if you are not comfortable going in person to a medical facility at this time.  NOTE: You will NOT be charged for this eVisit.  If you do not have a PCP, [facility] offers a free physician referral service available at [phone]. Our trained staff has the experience, knowledge and resources to put you in touch with a physician who is right for you.    If you are having a true medical emergency please call 911.   Your e-visit answers were reviewed by a board certified advanced clinical practitioner to complete your personal care plan.  Thank you for using e-Visits.

## 2022-11-17 NOTE — Progress Notes (Signed)
Because we have seen you for back pain in the past this seems to be a recurrent issue, I feel your condition warrants further evaluation and I recommend that you be seen for a face to face visit.  Please contact your primary care physician practice to be seen. Many offices offer virtual options to be seen via video if you are not comfortable going in person to a medical facility at this time.  NOTE: You will NOT be charged for this eVisit.  If you do not have a PCP, [facility] offers a free physician referral service available at [phone]. Our trained staff has the experience, knowledge and resources to put you in touch with a physician who is right for you.    If you are having a true medical emergency please call 911.   Your e-visit answers were reviewed by a board certified advanced clinical practitioner to complete your personal care plan.  Thank you for using e-Visits.

## 2022-11-17 NOTE — Patient Instructions (Signed)
  Loni Beckwith, thank you for joining Leeanne Rio, PA-C for today's virtual visit.  While this provider is not your primary care provider (PCP), if your PCP is located in our provider database this encounter information will be shared with them immediately following your visit.   [facility] account gives you access to today's visit and all your visits, tests, and labs performed at [facility] " click here if you don't have a [facility] account or go to [email or web address]  Consent: (Patient) KEISHIA SHERBURNE provided verbal consent for this virtual visit at the beginning of the encounter.  Current Medications:  Current Outpatient Medications:    amoxicillin-clavulanate (AUGMENTIN) 875-125 MG tablet, Take 1 tablet by mouth 2 (two) times daily., Disp: 20 tablet, Rfl: 0   meloxicam (MOBIC) 15 MG tablet, Take 1 tablet (15 mg total) by mouth daily., Disp: 30 tablet, Rfl: 0   cyclobenzaprine (FLEXERIL) 5 MG tablet, Take 1 tablet (5 mg total) by mouth 3 (three) times daily as needed for muscle spasms., Disp: 21 tablet, Rfl: 0   fluticasone (FLONASE) 50 MCG/ACT nasal spray, Place 2 sprays into both nostrils daily., Disp: 16 g, Rfl: 0   Iron, Ferrous Sulfate, 325 (65 Fe) MG TABS, Take 325 mg by mouth daily., Disp: 180 tablet, Rfl: 1   Medications ordered in this encounter:  Meds ordered this encounter  Medications   amoxicillin-clavulanate (AUGMENTIN) 875-125 MG tablet    Sig: Take 1 tablet by mouth 2 (two) times daily.    Dispense:  20 tablet    Refill:  0    Order Specific Question:   Supervising Provider    Answer:   Chase Picket [id number]   meloxicam (MOBIC) 15 MG tablet    Sig: Take 1 tablet (15 mg total) by mouth daily.    Dispense:  30 tablet    Refill:  0    Order Specific Question:   Supervising Provider    Answer:   Chase Picket [id number]     *If you need refills on other medications prior to your next appointment,  please contact your pharmacy*  Follow-Up: Call back or seek an in-person evaluation if the symptoms worsen or if the condition fails to improve as anticipated.  [place] [phone]  Other Instructions Please continue oral hygiene. Start use of the clove paste as discussed. Meloxicam once daily with food. Tylenol for breakthrough pain. Take antibiotic as directed. See resources below to help get you in sooner with dental provider.  Follow-up with your PCP for any further issues arising before you can be evaluated by dental provider.    If you have been instructed to have an in-person evaluation today at a local Urgent Care facility, please use the link below. It will take you to a list of all of our available [facility], including address, phone number and hours of operation. Please do not delay care.  [facility]  If you or a family member do not have a primary care provider, use the link below to schedule a visit and establish care. When you choose a [facility] primary care physician or advanced practice provider, you gain a long-term partner in health. Find a Primary Care Provider  Learn more about [facility]'s in-office and virtual care options: [facility]

## 2022-11-17 NOTE — Progress Notes (Signed)
Virtual Visit Consent   Natalie Orr, you are scheduled for a virtual visit with a [facility] provider today. Just as with appointments in the office, your consent must be obtained to participate. Your consent will be active for this visit and any virtual visit you may have with one of our providers in the next 365 days. If you have a MyChart account, a copy of this consent can be sent to you electronically.  As this is a virtual visit, video technology does not allow for your provider to perform a traditional examination. This may limit your provider's ability to fully assess your condition. If your provider identifies any concerns that need to be evaluated in person or the need to arrange testing (such as labs, EKG, etc.), we will make arrangements to do so. Although advances in technology are sophisticated, we cannot ensure that it will always work on either your end or our end. If the connection with a video visit is poor, the visit may have to be switched to a telephone visit. With either a video or telephone visit, we are not always able to ensure that we have a secure connection.  By engaging in this virtual visit, you consent to the provision of healthcare and authorize for your insurance to be billed (if applicable) for the services provided during this visit. Depending on your insurance coverage, you may receive a charge related to this service.  I need to obtain your verbal consent now. Are you willing to proceed with your visit today? Natalie Orr has provided verbal consent on 11/17/2022 for a virtual visit (video or telephone). Leeanne Rio, [place]  Date: 11/17/2022 10:09 AM  Virtual Visit via Video Note   I, Leeanne Rio, connected with  Natalie Orr  ([id number], 01/08/1981) on 11/17/22 at  9:45 AM EDT by a video-enabled telemedicine application and verified that I am speaking with the correct person using two identifiers.  Location: Patient: Virtual  Visit Location Patient: Home Provider: Virtual Visit Location Provider: Home Office   I discussed the limitations of evaluation and management by telemedicine and the availability of in person appointments. The patient expressed understanding and agreed to proceed.    History of Present Illness: Natalie Orr is a 42 y.o. who identifies as a female who was assigned female at birth, and is being seen today for acute on chronic dental pain and concern for infection of R lower molar. Has been trying to get tooth pulled but her insurance does not go into effect until April and she notes she just cannot afford the cost until then. Denies fever, chills. Is able to fully open and close her jaw.    HPI: HPI  Problems:  Patient Active Problem List   Diagnosis Date Noted   Menorrhagia with irregular cycle 02/03/2022   Iron deficiency anemia due to chronic blood loss 02/03/2022    Allergies: No Known Allergies Medications:  Current Outpatient Medications:    amoxicillin-clavulanate (AUGMENTIN) 875-125 MG tablet, Take 1 tablet by mouth 2 (two) times daily., Disp: 20 tablet, Rfl: 0   meloxicam (MOBIC) 15 MG tablet, Take 1 tablet (15 mg total) by mouth daily., Disp: 30 tablet, Rfl: 0   cyclobenzaprine (FLEXERIL) 5 MG tablet, Take 1 tablet (5 mg total) by mouth 3 (three) times daily as needed for muscle spasms., Disp: 21 tablet, Rfl: 0   fluticasone (FLONASE) 50 MCG/ACT nasal spray, Place 2 sprays into both nostrils daily., Disp: 16 g, Rfl: 0  Iron, Ferrous Sulfate, 325 (65 Fe) MG TABS, Take 325 mg by mouth daily., Disp: 180 tablet, Rfl: 1  Observations/Objective: Patient is well-developed, well-nourished in no acute distress.  No labored breathing.  Speech is clear and coherent with logical content.  Patient is alert and oriented at baseline.    Assessment and Plan: 1. Dental infection - amoxicillin-clavulanate (AUGMENTIN) 875-125 MG tablet; Take 1 tablet by mouth 2 (two) times daily.   Dispense: 20 tablet; Refill: 0  2. Chronic dental pain - meloxicam (MOBIC) 15 MG tablet; Take 1 tablet (15 mg total) by mouth daily.  Dispense: 30 tablet; Refill: 0  Supportive measures reviewed. Start use of topical Clove paste. Meloxicam per orders. OTC Tylenol if needed. Antibiotic for acute infection. List of resources given so she can be seen prior to April hopefully.   Follow Up Instructions: I discussed the assessment and treatment plan with the patient. The patient was provided an opportunity to ask questions and all were answered. The patient agreed with the plan and demonstrated an understanding of the instructions.  A copy of instructions were sent to the patient via MyChart unless otherwise noted below.   The patient was advised to call back or seek an in-person evaluation if the symptoms worsen or if the condition fails to improve as anticipated.  Time:  I spent 12 minutes with the patient via telehealth technology discussing the above problems/concerns.    Leeanne Rio, PA-C

## 2022-11-17 NOTE — Progress Notes (Signed)
Because you have been treated for dental issue in the past month or so, and you have already submitted two e-visits for back pain this morning at which time you were instructed to be evaluated in person due to recurrent nature, I feel your condition warrants further evaluation and I recommend that you be seen in a face to face visit.   NOTE: There will be NO CHARGE for this eVisit   If you are having a true medical emergency please call 911.      For an urgent face to face visit, [facility] has eight urgent care centers for your convenience:   NEW!! [facility] [place] [facility] [place] Get Driving Directions [id number] [address] [place], [place] [facility] [place] at [place] Get Driving Directions [id number] [place], [place] [address]   [place] [facility]) Get Driving Directions [id number] [address], [place] [address]  [facility] [place] ([place]) Get Driving Directions [id number] [address] [place],  [place]  [address]  [place] [facility] [place] [facility] - at [address] Get Driving Directions  [phone] [address] [place],  [place] [address]   [facility] Get Driving Directions [id number] [address], [place], [place] [address]   [facility] Get Driving Directions  [id number] [address], [place] [address]    [facility] at [place] Get Driving Directions [id number] [address] [place],  [address]  Your MyChart E-visit questionnaire answers were reviewed by a board certified advanced clinical practitioner to complete your personal care plan based on your specific symptoms.  Thank you for using e-Visits.

## 2022-12-28 ENCOUNTER — Telehealth: Payer: Self-pay | Admitting: Physician Assistant

## 2022-12-28 DIAGNOSIS — R3989 Other symptoms and signs involving the genitourinary system: Secondary | ICD-10-CM

## 2022-12-28 MED ORDER — CEPHALEXIN 500 MG PO CAPS: 500.0000 mg | ORAL_CAPSULE | Freq: Two times a day (BID) | ORAL | 0 refills | Status: AC

## 2022-12-28 NOTE — Progress Notes (Signed)

## 2022-12-28 NOTE — Progress Notes (Signed)
I have spent 5 minutes in review of e-visit questionnaire, review and updating patient chart, medical decision making and response to patient.   Camellia Popescu Cody Tyde Lamison, PA-C    

## 2023-01-13 ENCOUNTER — Telehealth: Payer: Self-pay | Admitting: Physician Assistant

## 2023-01-13 DIAGNOSIS — B353 Tinea pedis: Secondary | ICD-10-CM

## 2023-01-13 NOTE — Progress Notes (Signed)
I have spent 5 minutes in review of e-visit questionnaire, review and updating patient chart, medical decision making and response to patient.   Arora Coakley Cody Joani Cosma, PA-C    

## 2023-01-13 NOTE — Progress Notes (Signed)
E-Visit for Athlete's Foot  We are sorry that you are not feeling well. Here is how we plan to help!  Based on what you shared with me it looks like you have tinea pedis, or "Athlete's Foot".  This type of rash can spread through shared towels, clothing, bedding, etc., as well as hard surfaces (particularly in moist areas) such as shower stalls, locker room floors, pool areas, etc. The symptoms of Athlete's Foot include red, swollen, peeling, itchy skin between the toes (especially between the pinky toe and the one next to it). The sole and heel of the foot may also be affected. In severe cases, the skin on the feet can blister.  Athlete's foot can usually be treated with over-the-counter topical antifungal products; but sometimes with chronic or extensive tinea pedis, prescription oral medications are needed.   I am recommending:Terbinafine 1% cream or gel, apply to area once or twice per day   Prescription medications are only indicated for an extensive rash or if over the counter treatments have failed.   HOME CARE:  Keep feet clean, dry, and cool. Avoid using swimming pools, public showers, or foot baths. Wear sandals when possible or air shoes out by alternating them every 2-3 days. Avoid wearing closed shoes and wearing socks made from fabric that doesn't dry easily (for example, nylon). Treat the infection with recommended medication  GET HELP RIGHT AWAY IF:  Symptoms that don't go away after treatment. Severe itching that persists. If your rash spreads or swells. If your rash begins to have drainage or smell. You develop a fever.  MAKE SURE YOU   Understand these instructions. Will watch your condition. Will get help right away if you are not doing well or get worse.   Thank you for choosing an e-visit.  Your e-visit answers were reviewed by a board certified advanced clinical practitioner to complete your personal care plan. Depending upon the condition, your  plan could have included both over the counter or prescription medications.  Please review your pharmacy choice. Make sure the pharmacy is open so you can pick up prescription now. If there is a problem, you may contact your provider through [employer] and have the prescription routed to another pharmacy.  Your safety is important to [place]. If you have drug allergies check your prescription carefully.   For the next 24 hours you can use MyChart to ask questions about today's visit, request a non-urgent call back, or ask for a work or school excuse.  You will get an email in the next two days asking about your experience. I hope that your e-visit has been valuable and will speed your recovery  References or for more information:  FatMenus.com.au?search=athletes%[email or web address]%[email or web address]&source=search_result&selectedTitle=1~104&usage_type=default&display_rank=1  [email or web address]

## 2023-01-16 ENCOUNTER — Telehealth: Payer: Self-pay | Admitting: Family

## 2023-01-16 DIAGNOSIS — B353 Tinea pedis: Secondary | ICD-10-CM

## 2023-01-16 MED ORDER — TERBINAFINE HCL 1 % EX CREA: 1.0000 | TOPICAL_CREAM | Freq: Two times a day (BID) | CUTANEOUS | 0 refills | Status: DC

## 2023-01-16 MED ORDER — CLOTRIMAZOLE 1 % EX CREA: 1.0000 | TOPICAL_CREAM | Freq: Two times a day (BID) | CUTANEOUS | 0 refills | Status: DC

## 2023-01-16 NOTE — Addendum Note (Signed)
Addended by: Jannifer Rodney A on: 01/16/2023 10:10 AM   Modules accepted: Orders

## 2023-01-16 NOTE — Progress Notes (Signed)
Hello, I have resent the cream Cody ordered for you. Please let me know if you have any other problems getting the medication.   Jannifer Rodney, FNP

## 2023-01-16 NOTE — Addendum Note (Signed)
Addended by: Jannifer Rodney A on: 01/16/2023 09:50 AM   Modules accepted: Orders

## 2023-02-26 ENCOUNTER — Telehealth: Payer: 59 | Admitting: Nurse Practitioner

## 2023-02-26 DIAGNOSIS — M545 Low back pain, unspecified: Secondary | ICD-10-CM

## 2023-02-26 NOTE — Progress Notes (Signed)
Because we treated you for a back injury in March and we do not see a follow up in person since that time, I feel your condition warrants further evaluation and I recommend that you be seen for a face to face visit.  Please contact your primary care physician practice to be seen. Many offices offer virtual options to be seen via video if you are not comfortable going in person to a medical facility at this time.  NOTE: You will NOT be charged for this eVisit.  If you do not have a PCP,  offers a free physician referral service available at [phone]. Our trained staff has the experience, knowledge and resources to put you in touch with a physician who is right for you.    If you are having a true medical emergency please call 911.   Your e-visit answers were reviewed by a board certified advanced clinical practitioner to complete your personal care plan.  Thank you for using e

## 2023-03-08 ENCOUNTER — Emergency Department: Payer: 59

## 2023-03-08 ENCOUNTER — Emergency Department
Admission: EM | Admit: 2023-03-08 | Discharge: 2023-03-08 | Disposition: A | Discharge status: Home / Self Care | Payer: 59 | Attending: Emergency Medicine | Admitting: Emergency Medicine

## 2023-03-08 ENCOUNTER — Other Ambulatory Visit: Payer: Self-pay

## 2023-03-08 DIAGNOSIS — M79674 Pain in right toe(s): Secondary | ICD-10-CM | POA: Diagnosis not present

## 2023-03-08 DIAGNOSIS — W1840XA Slipping, tripping and stumbling without falling, unspecified, initial encounter: Secondary | ICD-10-CM | POA: Diagnosis not present

## 2023-03-08 DIAGNOSIS — S9031XA Contusion of right foot, initial encounter: Secondary | ICD-10-CM | POA: Insufficient documentation

## 2023-03-08 DIAGNOSIS — S99921A Unspecified injury of right foot, initial encounter: Secondary | ICD-10-CM | POA: Diagnosis not present

## 2023-03-08 NOTE — Discharge Instructions (Signed)
Follow-up with your primary care provider if any continued problems or concerns.  Ice and elevation to help reduce any pain or swelling.  Take Tylenol or ibuprofen as needed for pain.  A postoperative shoe was placed on your foot to give you added support and protection.  Wear this until you are able to stand without pain.

## 2023-03-08 NOTE — ED Provider Notes (Signed)
[facility] Provider Note    Event Date/Time   First MD Initiated Contact with Patient 03/08/23 [phone]     (approximate)   History   Foot Injury   HPI  Natalie Orr is a 42 y.o. female   presents to the ED with complaint of right foot pain.  Patient states that she slipped on some juice and her foot went underneath the dishwasher.  She has had difficulty with walking since that time.      Physical Exam   Triage Vital Signs: ED Triage Vitals  Encounter Vitals Group     BP 03/08/23 0739 (!) 103/54     Systolic BP Percentile --      Diastolic BP Percentile --      Pulse Rate 03/08/23 0739 80     Resp 03/08/23 0739 18     Temp 03/08/23 0739 98.1 F (36.7 C)     Temp Source 03/08/23 0739 Oral     SpO2 03/08/23 0739 100 %     Weight 03/08/23 0738 218 lb 0.6 oz (98.9 kg)     Height 03/08/23 0738 4\' 11"  (1.499 m)     Head Circumference --      Peak Flow --      Pain Score 03/08/23 0738 9     Pain Loc --      Pain Education --      Exclude from Growth Chart --     Most recent vital signs: Vitals:   03/08/23 0739  BP: (!) 103/54  Pulse: 80  Resp: 18  Temp: 98.1 F (36.7 C)  SpO2: 100%     General: Awake, no distress.  CV:  Good peripheral perfusion.  Resp:  Normal effort.  Abd:  No distention.  Other:  Right foot without gross deformity.  There is some ecchymosis noted to the base of the right third digit.  Skin is intact.  Markedly tender to palpation.  Pulses present.   ED Results / Procedures / Treatments   Labs (all labs ordered are listed, but only abnormal results are displayed) Labs Reviewed - No data to display   RADIOLOGY Right foot x-ray images were reviewed by myself independent of the radiologist and was negative for fracture.    PROCEDURES:  Critical Care performed:   Procedures   MEDICATIONS ORDERED IN ED: Medications - No data to display   IMPRESSION / MDM / ASSESSMENT AND PLAN / ED COURSE  I  reviewed the triage vital signs and the nursing notes.   Differential diagnosis includes, but is not limited to, contusion, fracture, sprain, foreign body, crush injury.  42 year old female presents to the ED with complaint of right foot pain after an injury that occurred this morning.  X-rays are reassuring and patient was made aware that they were negative.  She was instructed to ice and elevate to reduce pain and swelling.  A postop shoe was ordered and she is to use this until she is able to ambulate without any pain.  She is instructed to follow-up with her PCP if any continued problems.      Patient's presentation is most consistent with acute complicated illness / injury requiring diagnostic workup.  FINAL CLINICAL IMPRESSION(S) / ED DIAGNOSES   Final diagnoses:  Contusion of right foot, initial encounter     Rx / DC Orders   ED Discharge Orders     None        Note:  This document was  prepared using Conservation officer, historic buildings and may include unintentional dictation errors.   Tommi Rumps, PA-C 03/08/23 [id number]    Trinna Post, MD 03/09/23 2002

## 2023-03-08 NOTE — ED Notes (Signed)
See triage notes. Patient stated she slipped on something that had been spilled on the floor around 2330 last night. Patient stated her foot went under the dishwasher and she believes her toe is broken.

## 2023-03-08 NOTE — ED Triage Notes (Signed)
Slipped on juice on floor, slipped and left foot went up under the dishwasher.  C/O right third to pain.

## 2023-03-09 ENCOUNTER — Telehealth: Payer: 59 | Admitting: Family Medicine

## 2023-03-09 ENCOUNTER — Telehealth: Payer: 59 | Admitting: Physician Assistant

## 2023-03-09 DIAGNOSIS — S90121D Contusion of right lesser toe(s) without damage to nail, subsequent encounter: Secondary | ICD-10-CM | POA: Diagnosis not present

## 2023-03-09 DIAGNOSIS — S9031XD Contusion of right foot, subsequent encounter: Secondary | ICD-10-CM

## 2023-03-09 MED ORDER — MELOXICAM 5 MG PO CAPS
1.0000 | ORAL_CAPSULE | Freq: Every evening | ORAL | 0 refills | Status: DC | PRN
Start: 2023-03-09 — End: 2023-08-03

## 2023-03-09 MED ORDER — IBUPROFEN 800 MG PO TABS
800.0000 mg | ORAL_TABLET | Freq: Three times a day (TID) | ORAL | 0 refills | Status: DC | PRN
Start: 2023-03-09 — End: 2023-08-03

## 2023-03-09 NOTE — Patient Instructions (Addendum)
Natalie Orr, thank you for joining Freddy Finner, NP for today's virtual visit.  While this provider is not your primary care provider (PCP), if your PCP is located in our provider database this encounter information will be shared with them immediately following your visit.   A [facility] MyChart account gives you access to today's visit and all your visits, tests, and labs performed at [facility] " click here if you don't have a [facility] MyChart account or go to [email or web address]  Consent: (Patient) Natalie Orr provided verbal consent for this virtual visit at the beginning of the encounter.  Current Medications:  Current Outpatient Medications:    ibuprofen (ADVIL) 800 MG tablet, Take 1 tablet (800 mg total) by mouth every 8 (eight) hours as needed for moderate pain., Disp: 20 tablet, Rfl: 0   Meloxicam 5 MG CAPS, Take 1 capsule by mouth at bedtime as needed., Disp: 30 capsule, Rfl: 0   clotrimazole (LOTRIMIN) 1 % cream, Apply 1 Application topically 2 (two) times daily., Disp: 60 g, Rfl: 0   fluticasone (FLONASE) 50 MCG/ACT nasal spray, Place 2 sprays into both nostrils daily., Disp: 16 g, Rfl: 0   Iron, Ferrous Sulfate, 325 (65 Fe) MG TABS, Take 325 mg by mouth daily., Disp: 180 tablet, Rfl: 1   terbinafine (LAMISIL) 1 % cream, Apply 1 Application topically 2 (two) times daily., Disp: 60 g, Rfl: 0   Medications ordered in this encounter:  Meds ordered this encounter  Medications   Meloxicam 5 MG CAPS    Sig: Take 1 capsule by mouth at bedtime as needed.    Dispense:  30 capsule    Refill:  0    Order Specific Question:   Supervising Provider    Answer:   Merrilee Jansky [id number]   ibuprofen (ADVIL) 800 MG tablet    Sig: Take 1 tablet (800 mg total) by mouth every 8 (eight) hours as needed for moderate pain.    Dispense:  20 tablet    Refill:  0    Order Specific Question:   Supervising Provider    Answer:   Merrilee Jansky [id number]      *If you need refills on other medications prior to your next appointment, please contact your pharmacy*  Follow-Up: Call back or seek an in-person evaluation if the symptoms worsen or if the condition fails to improve as anticipated.  [facility] [phone]  Other Instructions Contusion A contusion is a deep bruise. This is a result of an injury that causes bleeding under the skin. Symptoms of bruising include pain, swelling, and discolored skin. The skin may turn blue, purple, or yellow. Follow these instructions at home: Managing pain, stiffness, and swelling You may use RICE. This stands for: Resting. Icing. Compression, or putting pressure on the injured area. Elevating, or raising the injured area. To follow this method, do these actions: Rest the injured area. If told, put ice on the injured area. To do this: Put ice in a plastic bag. Place a towel between your skin and the bag. Leave the ice on for 20 minutes, 2-3 times per day. If your skin turns bright red, take off the ice right away to prevent skin damage. The risk of skin damage is higher if you cannot feel pain, heat, or cold. If told, apply compression on the injured area using an elastic bandage. Make sure the bandage is not too tight. If the area tingles or has a  loss of feeling (numbness), remove it and put it back on as told by your doctor. If possible, elevate the injured area above the level of your heart while you are sitting or lying down.  General instructions Take over-the-counter and prescription medicines only as told by your doctor. Keep all follow-up visits. Your doctor may want to see how your contusion is healing with treatment. Contact a doctor if: Your symptoms do not get better after several days of treatment. Your symptoms get worse. You have trouble moving the injured area. Get help right away if: You have very bad pain. You have a loss of feeling (numbness) in a hand or  foot. Your hand or foot turns pale or cold. This information is not intended to replace advice given to you by your health care provider. Make sure you discuss any questions you have with your health care provider. Document Revised: 01/26/2022 Document Reviewed: 01/26/2022 Elsevier Patient Education  2024 Elsevier Inc.    If you have been instructed to have an in-person evaluation today at a local Urgent Care facility, please use the link below. It will take you to a list of all of our available [facility], including address, phone number and hours of operation. Please do not delay care.  [facility]  If you or a family member do not have a primary care provider, use the link below to schedule a visit and establish care. When you choose a [facility] primary care physician or advanced practice provider, you gain a long-term partner in health. Find a Primary Care Provider  Learn more about [facility]'s in-office and virtual care options: [facility] - Get Care Now

## 2023-03-09 NOTE — Progress Notes (Signed)
Virtual Visit Consent   Natalie Orr, you are scheduled for a virtual visit with a [facility] provider today. Just as with appointments in the office, your consent must be obtained to participate. Your consent will be active for this visit and any virtual visit you may have with one of our providers in the next 365 days. If you have a MyChart account, a copy of this consent can be sent to you electronically.  As this is a virtual visit, video technology does not allow for your provider to perform a traditional examination. This may limit your provider's ability to fully assess your condition. If your provider identifies any concerns that need to be evaluated in person or the need to arrange testing (such as labs, EKG, etc.), we will make arrangements to do so. Although advances in technology are sophisticated, we cannot ensure that it will always work on either your end or our end. If the connection with a video visit is poor, the visit may have to be switched to a telephone visit. With either a video or telephone visit, we are not always able to ensure that we have a secure connection.  By engaging in this virtual visit, you consent to the provision of healthcare and authorize for your insurance to be billed (if applicable) for the services provided during this visit. Depending on your insurance coverage, you may receive a charge related to this service.  I need to obtain your verbal consent now. Are you willing to proceed with your visit today? ALIZAY BRONKEMA has provided verbal consent on 03/09/2023 for a virtual visit (video or telephone). Freddy Finner, NP  Date: 03/09/2023 2:37 PM  Virtual Visit via Video Note   I, Freddy Finner, connected with  Natalie Orr  ([id number], 10-05-1980) on 03/09/23 at  2:30 PM EDT by a video-enabled telemedicine application and verified that I am speaking with the correct person using two identifiers.  Location: Patient: Virtual Visit Location  Patient: Home Provider: Virtual Visit Location Provider: Home Office   I discussed the limitations of evaluation and management by telemedicine and the availability of in person appointments. The patient expressed understanding and agreed to proceed.    History of Present Illness: HOLIDAY MCMENAMIN is a 42 y.o. who identifies as a female who was assigned female at birth, and is being seen today for pain in a contused toe- right foot.  Reports still having pain after Fall yesterday. OTC tylenol and Ibuprofen not helping. Taking 400 mg  as needed with one tylenol. Denies changes to foot.   Was seen yesterday at [facility] for a fall. Was in kitchen, slipped on juice and her foot went underneath the dishwasher. She had pain immediately. Xray negative at ED- Ecchymosis seen at base of right third digit and tender to touch in ED. Pulse noted present. Placed in post op shoe to help with reduced pressure to area. RICE measures given.    Problems:  Patient Active Problem List   Diagnosis Date Noted   Menorrhagia with irregular cycle 02/03/2022   Iron deficiency anemia due to chronic blood loss 02/03/2022    Allergies: No Known Allergies Medications:  Current Outpatient Medications:    clotrimazole (LOTRIMIN) 1 % cream, Apply 1 Application topically 2 (two) times daily., Disp: 60 g, Rfl: 0   fluticasone (FLONASE) 50 MCG/ACT nasal spray, Place 2 sprays into both nostrils daily., Disp: 16 g, Rfl: 0   Iron, Ferrous Sulfate, 325 (65 Fe) MG TABS,  Take 325 mg by mouth daily., Disp: 180 tablet, Rfl: 1   terbinafine (LAMISIL) 1 % cream, Apply 1 Application topically 2 (two) times daily., Disp: 60 g, Rfl: 0  Observations/Objective: Patient is well-developed, well-nourished in no acute distress.  Resting comfortably  at home.  Head is normocephalic, atraumatic.  No labored breathing.  Speech is clear and coherent with logical content.  Patient is alert and oriented at baseline.    Assessment and  Plan:  1. Contusion of lesser toe of right foot without damage to nail, subsequent encounter  - Meloxicam 5 MG CAPS; Take 1 capsule by mouth at bedtime as needed.  Dispense: 30 capsule; Refill: 0 - ibuprofen (ADVIL) 800 MG tablet; Take 1 tablet (800 mg total) by mouth every 8 (eight) hours as needed for moderate pain.  Dispense: 20 tablet; Refill: 0  -RICE measures -Above Rx to help with pain control - strict in person and follow up with ED or PCP if not improving  Reviewed side effects, risks and benefits of medication.    Patient acknowledged agreement and understanding of the plan.   Past Medical, Surgical, Social History, Allergies, and Medications have been Reviewed.     Reviewed side effects, risks and benefits of medication.    Patient acknowledged agreement and understanding of the plan.   Past Medical, Surgical, Social History, Allergies, and Medications have been Reviewed.   Follow Up Instructions: I discussed the assessment and treatment plan with the patient. The patient was provided an opportunity to ask questions and all were answered. The patient agreed with the plan and demonstrated an understanding of the instructions.  A copy of instructions were sent to the patient via MyChart unless otherwise noted below.     The patient was advised to call back or seek an in-person evaluation if the symptoms worsen or if the condition fails to improve as anticipated.  Time:  I spent 10 minutes with the patient via telehealth technology discussing the above problems/concerns.    Freddy Finner, NP

## 2023-03-09 NOTE — Progress Notes (Signed)
   Thank you for the details you included in the comment boxes. Those details are very helpful in determining the best course of treatment for you and help [place] to provide the best care.Because this is not something we treat via e-visit, we recommend that you convert this visit to a video visit in order for the provider to better assess what is going on.  The provider will be able to give you a more accurate diagnosis and treatment plan if we can more freely discuss your symptoms and with the addition of a virtual examination. Just as a note, while we can provide some medications for pain through a video visit, like e-visits, we do not prescribe any opioid pain medications.   If you convert to a video visit, we will bill your insurance (similar to an office visit) and you will not be charged for this e-Visit. You will be able to stay at home and speak with the first available [facility] advanced practice provider. The link to do a video visit is in the drop down Menu tab of your Welcome screen in MyChart.

## 2023-03-22 ENCOUNTER — Encounter: Payer: Self-pay | Admitting: Nurse Practitioner

## 2023-03-22 ENCOUNTER — Ambulatory Visit: Payer: 59 | Admitting: Nurse Practitioner

## 2023-03-22 ENCOUNTER — Telehealth: Payer: Self-pay | Admitting: Nurse Practitioner

## 2023-03-22 NOTE — Telephone Encounter (Signed)
2nd no show, fee generated, final warning sent via mychart

## 2023-03-22 NOTE — Telephone Encounter (Signed)
Pt was a no show for an OV with [facility] on 03/22/23, I sent a no show letter.

## 2023-04-08 ENCOUNTER — Telehealth: Payer: 59 | Admitting: Physician Assistant

## 2023-04-08 DIAGNOSIS — K047 Periapical abscess without sinus: Secondary | ICD-10-CM

## 2023-04-08 MED ORDER — AMOXICILLIN-POT CLAVULANATE 875-125 MG PO TABS
1.0000 | ORAL_TABLET | Freq: Two times a day (BID) | ORAL | 0 refills | Status: DC
Start: 2023-04-08 — End: 2023-07-09

## 2023-04-08 NOTE — Progress Notes (Signed)

## 2023-04-16 ENCOUNTER — Telehealth: Payer: 59 | Admitting: Nurse Practitioner

## 2023-04-16 DIAGNOSIS — H02729 Madarosis of unspecified eye, unspecified eyelid and periocular area: Secondary | ICD-10-CM

## 2023-04-16 NOTE — Progress Notes (Signed)
Gearldine Bienenstock, Thank you for submitting an e-visit request. We are not currently prescribing Latisse via E-visits. We encourage you to speak to your primary care about this product  I feel your condition warrants further evaluation and I recommend that you be seen for a face to face visit.  Please contact your primary care physician practice to be seen. Many offices offer virtual options to be seen via video if you are not comfortable going in person to a medical facility at this time.  NOTE: You will NOT be charged for this eVisit.  If you do not have a PCP, [facility] offers a free physician referral service available at [phone]. Our trained staff has the experience, knowledge and resources to put you in touch with a physician who is right for you.    If you are having a true medical emergency please call 911.   Your e-visit answers were reviewed by a board certified advanced clinical practitioner to complete your personal care plan.  Thank you for using e-Visits.

## 2023-06-15 ENCOUNTER — Encounter: Payer: Self-pay | Admitting: Nurse Practitioner

## 2023-07-09 ENCOUNTER — Telehealth: Payer: 59 | Admitting: Physician Assistant

## 2023-07-09 DIAGNOSIS — K047 Periapical abscess without sinus: Secondary | ICD-10-CM | POA: Diagnosis not present

## 2023-07-09 MED ORDER — AMOXICILLIN-POT CLAVULANATE 875-125 MG PO TABS
1.0000 | ORAL_TABLET | Freq: Two times a day (BID) | ORAL | 0 refills | Status: DC
Start: 2023-07-09 — End: 2023-08-03

## 2023-07-09 NOTE — Progress Notes (Signed)
E-Visit for Dental Pain  We are sorry that you are not feeling well.  Here is how we plan to help!  Based on what you have shared with me in the questionnaire, it sounds like you have a dental infection.  Augmentin 875-125mg twice a day for 7 days  It is imperative that you see a dentist within 10 days of this eVisit to determine the cause of the dental pain and be sure it is adequately treated  A toothache or tooth pain is caused when the nerve in the root of a tooth or surrounding a tooth is irritated. Dental (tooth) infection, decay, injury, or loss of a tooth are the most common causes of dental pain. Pain may also occur after an extraction (tooth is pulled out). Pain sometimes originates from other areas and radiates to the jaw, thus appearing to be tooth pain.Bacteria growing inside your mouth can contribute to gum disease and dental decay, both of which can cause pain. A toothache occurs from inflammation of the central portion of the tooth called pulp. The pulp contains nerve endings that are very sensitive to pain. Inflammation to the pulp or pulpitis may be caused by dental cavities, trauma, and infection.    HOME CARE:   For toothaches: Over-the-counter pain medications such as acetaminophen or ibuprofen may be used. Take these as directed on the package while you arrange for a dental appointment. Avoid very cold or hot foods, because they may make the pain worse. You may get relief from biting on a cotton ball soaked in oil of cloves. You can get oil of cloves at most drug stores.  For jaw pain:  Aspirin may be helpful for problems in the joint of the jaw in adults. If pain happens every time you open your mouth widely, the temporomandibular joint (TMJ) may be the source of the pain. Yawning or taking a large bite of food may worsen the pain. An appointment with your doctor or dentist will help you find the cause.     GET HELP RIGHT AWAY IF:  You have a high fever or  chills If you have had a recent head or face injury and develop headache, light headedness, nausea, vomiting, or other symptoms that concern you after an injury to your face or mouth, you could have a more serious injury in addition to your dental injury. A facial rash associated with a toothache: This condition may improve with medication. Contact your doctor for them to decide what is appropriate. Any jaw pain occurring with chest pain: Although jaw pain is most commonly caused by dental disease, it is sometimes referred pain from other areas. People with heart disease, especially people who have had stents placed, people with diabetes, or those who have had heart surgery may have jaw pain as a symptom of heart attack or angina. If your jaw or tooth pain is associated with lightheadedness, sweating, or shortness of breath, you should see a doctor as soon as possible. Trouble swallowing or excessive pain or bleeding from gums: If you have a history of a weakened immune system, diabetes, or steroid use, you may be more susceptible to infections. Infections can often be more severe and extensive or caused by unusual organisms. Dental and gum infections in people with these conditions may require more aggressive treatment. An abscess may need draining or IV antibiotics, for example.  MAKE SURE YOU   Understand these instructions. Will watch your condition. Will get help right away if you are   not doing well or get worse.  Thank you for choosing an e-visit.  Your e-visit answers were reviewed by a board certified advanced clinical practitioner to complete your personal care plan. Depending upon the condition, your plan could have included both over the counter or prescription medications.  Please review your pharmacy choice. Make sure the pharmacy is open so you can pick up prescription now. If there is a problem, you may contact your provider through MyChart messaging and have the prescription routed to  another pharmacy.  Your safety is important to us. If you have drug allergies check your prescription carefully.   For the next 24 hours you can use MyChart to ask questions about today's visit, request a non-urgent call back, or ask for a work or school excuse. You will get an email in the next two days asking about your experience. I hope that your e-visit has been valuable and will speed your recovery.  I have spent 5 minutes in review of e-visit questionnaire, review and updating patient chart, medical decision making and response to patient.   Jennifer M Burnette, PA-C  

## 2023-07-16 ENCOUNTER — Telehealth: Payer: 59 | Admitting: Family Medicine

## 2023-07-16 DIAGNOSIS — B3731 Acute candidiasis of vulva and vagina: Secondary | ICD-10-CM

## 2023-07-16 MED ORDER — FLUCONAZOLE 150 MG PO TABS: 150.0000 mg | ORAL_TABLET | Freq: Once | ORAL | 0 refills | Status: AC

## 2023-07-16 NOTE — Progress Notes (Signed)

## 2023-07-17 ENCOUNTER — Emergency Department: Payer: 59

## 2023-07-17 ENCOUNTER — Emergency Department
Admission: EM | Admit: 2023-07-17 | Discharge: 2023-07-17 | Disposition: A | Discharge status: Home / Self Care | Payer: 59 | Attending: Student in an Organized Health Care Education/Training Program | Admitting: Student in an Organized Health Care Education/Training Program

## 2023-07-17 DIAGNOSIS — M25561 Pain in right knee: Secondary | ICD-10-CM | POA: Insufficient documentation

## 2023-07-17 DIAGNOSIS — M1711 Unilateral primary osteoarthritis, right knee: Secondary | ICD-10-CM | POA: Diagnosis not present

## 2023-07-17 NOTE — Discharge Instructions (Signed)
Your x-ray shows mild degenerative changes.  Please follow-up with orthopedics if your symptoms persist.  You may wear the Ace wrap during the day, remove it at night.  Rest, ice, elevate your knee.  Please return for any new, worsening, or change in symptoms or other concerns.  It was a pleasure caring for you today.

## 2023-07-17 NOTE — ED Provider Notes (Signed)
[facility] Provider Note    Event Date/Time   First MD Initiated Contact with Patient 07/17/23 1342     (approximate)   History   Knee Pain   HPI  Natalie Orr is a 42 y.o. female who presents today for evaluation of right knee pain.  Patient reports that she was doing exercises with her significant other yesterday and thinks that this has exacerbated it.  She is able to ambulate.  She has not noticed any warmth or redness.  She is still able to ambulate.  Patient Active Problem List   Diagnosis Date Noted   Menorrhagia with irregular cycle 02/03/2022   Iron deficiency anemia due to chronic blood loss 02/03/2022          Physical Exam   Triage Vital Signs: ED Triage Vitals [07/17/23 1302]  Encounter Vitals Group     BP 126/86     Systolic BP Percentile      Diastolic BP Percentile      Pulse Rate 64     Resp      Temp 98.2 F (36.8 C)     Temp Source Oral     SpO2 99 %     Weight      Height      Head Circumference      Peak Flow      Pain Score      Pain Loc      Pain Education      Exclude from Growth Chart     Most recent vital signs: Vitals:   07/17/23 1302 07/17/23 1434  BP: 126/86   Pulse: 64   Resp:  17  Temp: 98.2 F (36.8 C)   SpO2: 99%     Physical Exam Vitals and nursing note reviewed.  Constitutional:      General: Awake and alert. No acute distress.    Appearance: Normal appearance. The patient is normal weight.  HENT:     Head: Normocephalic and atraumatic.     Mouth: Mucous membranes are moist.  Eyes:     General: PERRL. Normal EOMs        Right eye: No discharge.        Left eye: No discharge.     Conjunctiva/sclera: Conjunctivae normal.  Cardiovascular:     Rate and Rhythm: Normal rate and regular rhythm.     Pulses: Normal pulses.  Pulmonary:     Effort: Pulmonary effort is normal. No respiratory distress.     Breath sounds: Normal breath sounds.  Abdominal:     Abdomen is soft. There  is no abdominal tenderness. No rebound or guarding. No distention. Musculoskeletal:        General: No swelling. Normal range of motion.     Cervical back: Normal range of motion and neck supple.  Right knee: No deformity or rash. No joint line tenderness. No patellar tenderness, no ballotment Warm and well perfused extremity with 2+ pedal pulses 5/5 strength to dorsiflexion and plantarflexion at the ankle with intact sensation throughout extremity Normal range of motion of the knee, with intact flexion and extension to active and passive range of motion. Extensor mechanism intact. No ligamentous laxity. Negative anterior/posterior drawer/negative lachman, negative mcmurrays No effusion or warmth Intact quadriceps, hamstring function, patellar tendon function Pelvis stable Full ROM of ankle without pain or swelling Foot warm and well perfused No calf pain or swelling Skin:    General: Skin is warm and dry.  Capillary Refill: Capillary refill takes less than 2 seconds.     Findings: No rash.  Neurological:     Mental Status: The patient is awake and alert.      ED Results / Procedures / Treatments   Labs (all labs ordered are listed, but only abnormal results are displayed) Labs Reviewed - No data to display   EKG     RADIOLOGY I independently reviewed and interpreted imaging and agree with radiologists findings.     PROCEDURES:  Critical Care performed:   Procedures   MEDICATIONS ORDERED IN ED: Medications - No data to display   IMPRESSION / MDM / ASSESSMENT AND PLAN / ED COURSE  I reviewed the triage vital signs and the nursing notes.   Differential diagnosis includes, but is not limited to, effusion, sprain, contusion, dislocation, fracture, joint infection, tendon rupture, meniscal injury, patellofemoral syndrome. No evidence of neurological deficit or vascular compromise on exam. No fracture/dislocation on X-Ray. No deformity or obvious ligamentous  laxity on exam. No constitutional symptoms, warmth, erythema, or effusion to suggest septic joint. No history of immunosuppression. Overall well appearing, vital signs stable.  She is able to ambulate with a steady gait.  No calf tenderness or leg swelling to suggest DVT, no history of PE or DVT.  No indication for diagnostic or therapeutic procedure such as arthrocentesis. Return precautions and care instructions discussed. Outpatient follow-up advised, and she was given the appropriate follow-up information for orthopedics.. Patient agrees with plan of care.  She requested a work note which was provided.    Patient's presentation is most consistent with acute complicated illness / injury requiring diagnostic workup.     FINAL CLINICAL IMPRESSION(S) / ED DIAGNOSES   Final diagnoses:  Acute pain of right knee     Rx / DC Orders   ED Discharge Orders     None        Note:  This document was prepared using Dragon voice recognition software and may include unintentional dictation errors.   Jackelyn Hoehn, PA-C 07/17/23 1447    Willy Eddy, MD 07/17/23 1517

## 2023-07-17 NOTE — ED Notes (Signed)
Ace wrap applied to right knee

## 2023-07-17 NOTE — ED Triage Notes (Signed)
Pt via POV from home. Pt c/o R knee pain for the past 3 days. Denies injury. Denies previous surgeries on that knee. Pt is A&Ox4 and NAD, ambulatory to triage.

## 2023-07-19 ENCOUNTER — Telehealth: Payer: Self-pay

## 2023-07-19 NOTE — Telephone Encounter (Signed)
Spoke to patient and informed her that she an take OTC Ibuprofen 200 mg 2-3 tabs every 8 hours with food for pain until follow up appointment. If thing becomes worse we can move appointment up sooner. Patient verbalized understanding and stated she will try OTC medication first and will follow up if things become worse.

## 2023-07-19 NOTE — Transitions of Care (Post Inpatient/ED Visit) (Signed)
   07/19/2023  Name: Natalie Orr MRN: [id number] DOB: 04-26-1981  Today's TOC FU Call Status: Today's TOC FU Call Status:: Successful TOC FU Call Completed TOC FU Call Complete Date: 07/19/23 Patient's Name and Date of Birth confirmed.  Transition Care Management Follow-up Telephone Call Date of Discharge: 07/17/23 Discharge Facility: [facility]) Type of Discharge: Emergency Department Reason for ED Visit: Other: (Right knee pain) How have you been since you were released from the hospital?: Same Any questions or concerns?: Yes Patient Questions/Concerns:: Pain management. Nothing given in the ED for pain.  Items Reviewed: Did you receive and understand the discharge instructions provided?: Yes Any new allergies since your discharge?: No Dietary orders reviewed?: NA Do you have support at home?: Yes  Medications Reviewed Today: Medications Reviewed Today   Medications were not reviewed in this encounter     Home Care and Equipment/Supplies: Were Home Health Services Ordered?: NA Any new equipment or medical supplies ordered?: NA  Functional Questionnaire: Do you need assistance with bathing/showering or dressing?: No Do you need assistance with meal preparation?: No Do you need assistance with eating?: No Do you have difficulty maintaining continence: No Do you need assistance with getting out of bed/getting out of a chair/moving?: No Do you have difficulty managing or taking your medications?: No  Follow up appointments reviewed: PCP Follow-up appointment confirmed?: Yes Date of PCP follow-up appointment?: 07/28/23 Follow-up Provider: [facility] Follow-up appointment confirmed?: NA Do you need transportation to your follow-up appointment?: No Do you understand care options if your condition(s) worsen?: Yes-patient verbalized understanding    SIGNATURE Arvil Persons, BSN, RN

## 2023-08-03 ENCOUNTER — Encounter: Payer: Self-pay | Admitting: Nurse Practitioner

## 2023-08-03 ENCOUNTER — Other Ambulatory Visit: Payer: Self-pay | Admitting: Nurse Practitioner

## 2023-08-03 ENCOUNTER — Ambulatory Visit (INDEPENDENT_AMBULATORY_CARE_PROVIDER_SITE_OTHER): Payer: 59 | Admitting: Nurse Practitioner

## 2023-08-03 VITALS — BP 113/65 | HR 72 | Temp 98.2°F | Resp 18 | Ht 63.0 in | Wt 207.0 lb

## 2023-08-03 DIAGNOSIS — Z23 Encounter for immunization: Secondary | ICD-10-CM | POA: Diagnosis not present

## 2023-08-03 DIAGNOSIS — E66812 Obesity, class 2: Secondary | ICD-10-CM

## 2023-08-03 DIAGNOSIS — M25561 Pain in right knee: Secondary | ICD-10-CM | POA: Diagnosis not present

## 2023-08-03 DIAGNOSIS — D251 Intramural leiomyoma of uterus: Secondary | ICD-10-CM

## 2023-08-03 DIAGNOSIS — N921 Excessive and frequent menstruation with irregular cycle: Secondary | ICD-10-CM | POA: Diagnosis not present

## 2023-08-03 DIAGNOSIS — Z6838 Body mass index (BMI) 38.0-38.9, adult: Secondary | ICD-10-CM | POA: Diagnosis not present

## 2023-08-03 DIAGNOSIS — D500 Iron deficiency anemia secondary to blood loss (chronic): Secondary | ICD-10-CM

## 2023-08-03 DIAGNOSIS — E669 Obesity, unspecified: Secondary | ICD-10-CM | POA: Insufficient documentation

## 2023-08-03 LAB — COMPREHENSIVE METABOLIC PANEL
ALT: 10 U/L (ref 0–35)
AST: 16 U/L (ref 0–37)
Albumin: 4.2 g/dL (ref 3.5–5.2)
Alkaline Phosphatase: 116 U/L (ref 39–117)
BUN: 10 mg/dL (ref 6–23)
CO2: 31 meq/L (ref 19–32)
Calcium: 9.4 mg/dL (ref 8.4–10.5)
Chloride: 103 meq/L (ref 96–112)
Creatinine, Ser: 0.67 mg/dL (ref 0.40–1.20)
GFR: 107.85 mL/min (ref >60.00)
Glucose, Bld: 68 mg/dL — ABNORMAL LOW (ref 70–99)
Potassium: 4.4 meq/L (ref 3.5–5.1)
Sodium: 140 meq/L (ref 135–145)
Total Bilirubin: 0.5 mg/dL (ref 0.2–1.2)
Total Protein: 7.6 g/dL (ref 6.0–8.3)

## 2023-08-03 LAB — CBC WITH DIFFERENTIAL/PLATELET
Basophils Absolute: 0 10*3/uL (ref 0.0–0.1)
Basophils Relative: 0.6 % (ref 0.0–3.0)
Eosinophils Absolute: 0.1 10*3/uL (ref 0.0–0.7)
Eosinophils Relative: 2.6 % (ref 0.0–5.0)
HCT: 37.2 % (ref 36.0–46.0)
Hemoglobin: 11.9 g/dL — ABNORMAL LOW (ref 12.0–15.0)
Lymphocytes Relative: 36.5 % (ref 12.0–46.0)
Lymphs Abs: 2.1 10*3/uL (ref 0.7–4.0)
MCHC: 32 g/dL (ref 30.0–36.0)
MCV: 84.2 fL (ref 78.0–100.0)
Monocytes Absolute: 0.6 10*3/uL (ref 0.1–1.0)
Monocytes Relative: 10.1 % (ref 3.0–12.0)
Neutro Abs: 2.8 10*3/uL (ref 1.4–7.7)
Neutrophils Relative %: 50.2 % (ref 43.0–77.0)
Platelets: 407 10*3/uL — ABNORMAL HIGH (ref 150.0–400.0)
RBC: 4.41 Mil/uL (ref 3.87–5.11)
RDW: 14 % (ref 11.5–15.5)
WBC: 5.7 10*3/uL (ref 4.0–10.5)

## 2023-08-03 LAB — LIPID PANEL
Cholesterol: 210 mg/dL — ABNORMAL HIGH (ref 0–200)
HDL: 52.5 mg/dL (ref >39.00)
LDL Cholesterol: 139 mg/dL — ABNORMAL HIGH (ref 0–99)
NonHDL: 157.06
Total CHOL/HDL Ratio: 4
Triglycerides: 91 mg/dL (ref 0.0–149.0)
VLDL: 18.2 mg/dL (ref 0.0–40.0)

## 2023-08-03 LAB — HEMOGLOBIN A1C: Hgb A1c MFr Bld: 5.3 % (ref 4.6–6.5)

## 2023-08-03 LAB — TSH: TSH: 0.96 u[IU]/mL (ref 0.35–5.50)

## 2023-08-03 LAB — URIC ACID: Uric Acid, Serum: 4.6 mg/dL (ref 2.4–7.0)

## 2023-08-03 MED ORDER — MELOXICAM 15 MG PO TABS: 15.0000 mg | ORAL_TABLET | Freq: Every day | ORAL | 0 refills | Status: DC

## 2023-08-03 MED ORDER — WEGOVY 0.25 MG/0.5ML ~~LOC~~ SOAJ: 0.2500 mg | SUBCUTANEOUS | 0 refills | Status: DC

## 2023-08-03 NOTE — Assessment & Plan Note (Addendum)
Previous weight loss attempt: decreased caloric intake and exercise 2x/week x 46yr. Lost only 3lbs. No tobacco or ALCOHOL use Admits to high caloric diet, sugary drinks, and lack of exercise Wt Readings from Last 3 Encounters:  08/03/23 207 lb (93.9 kg)  03/08/23 218 lb 0.6 oz (98.9 kg)  02/03/22 204 lb 12.8 oz (92.9 kg)    We discussed use of GLP-1 injection vs contrave vs qsymia, their possible side effects and contraindications. Advised about need of diet modification and exercise once knee pain resolves. We agreed to start wegovy if covered by her insurance. She also agreed to nutritionist referral. [facility] 0.25mg  sent Check THYROID, CMP, lipid panel, and hgbA1c F/up in 14month

## 2023-08-03 NOTE — Assessment & Plan Note (Addendum)
LMP 05/2023 (lasted 10days), 11/11/2022 (lasted 5-6days), no clots, no Dysmenorrhea,  no dyspareunia Has palpable mass suprapubic S/p tubal ligation 74yrs ago  check pelvic US, CBC and iron panel

## 2023-08-03 NOTE — Progress Notes (Signed)
Established Patient Visit  Patient: Natalie Orr   DOB: Jan 16, 1981   42 y.o. Female  MRN: [id number] Visit Date: 08/03/2023  Subjective:    Chief Complaint  Patient presents with   Hospitalization Follow-up    Right knee pain. PT voiced pain is still current; she has on a knee brace to stabilized; she is due to TDAP vaccine    Knee Pain  The incident occurred 5 to 7 days ago. There was no injury mechanism. The pain is present in the right knee. The quality of the pain is described as aching. The pain is severe. The pain has been Constant since onset. Associated symptoms include an inability to bear weight. Pertinent negatives include no loss of motion, loss of sensation, muscle weakness, numbness or tingling. She reports no foreign bodies present. The symptoms are aggravated by movement, palpation and weight bearing. She has tried nothing for the symptoms.  Worse with prolonged sitting, standing, climbing stair. Current job entails prolong standing and walking.  Obesity Previous weight loss attempt: decreased caloric intake and exercise 2x/week x 20yr. Lost only 3lbs. No tobacco or ALCOHOL use Admits to high caloric diet, sugary drinks, and lack of exercise Wt Readings from Last 3 Encounters:  08/03/23 207 lb (93.9 kg)  03/08/23 218 lb 0.6 oz (98.9 kg)  02/03/22 204 lb 12.8 oz (92.9 kg)    We discussed use of GLP-1 injection vs contrave vs qsymia, their possible side effects and contraindications. Advised about need of diet modification and exercise once knee pain resolves. We agreed to start wegovy if covered by her insurance. She also agreed to nutritionist referral. [facility] 0.25mg  sent Check THYROID, CMP, lipid panel, and hgbA1c F/up in 1month  Menorrhagia with irregular cycle LMP 05/2023 (lasted 10days), 11/11/2022 (lasted 5-6days), no clots, no Dysmenorrhea,  no dyspareunia Has palpable mass suprapubic S/p tubal ligation 14yrs ago  check pelvic US, CBC  and iron panel Reviewed medical, surgical, and social history today  Medications: Outpatient Medications Prior to Visit  Medication Sig   fluticasone (FLONASE) 50 MCG/ACT nasal spray Place 2 sprays into both nostrils daily.   Iron, Ferrous Sulfate, 325 (65 Fe) MG TABS Take 325 mg by mouth daily.   [DISCONTINUED] amoxicillin-clavulanate (AUGMENTIN) 875-125 MG tablet Take 1 tablet by mouth 2 (two) times daily.   [DISCONTINUED] clotrimazole (LOTRIMIN) 1 % cream Apply 1 Application topically 2 (two) times daily.   [DISCONTINUED] fluconazole (DIFLUCAN) 150 MG tablet Take 150 mg by mouth once.   [DISCONTINUED] terbinafine (LAMISIL) 1 % cream Apply 1 Application topically 2 (two) times daily.   [DISCONTINUED] ibuprofen (ADVIL) 800 MG tablet Take 1 tablet (800 mg total) by mouth every 8 (eight) hours as needed for moderate pain. (Patient not taking: Reported on 08/03/2023)   [DISCONTINUED] Meloxicam 5 MG CAPS Take 1 capsule by mouth at bedtime as needed. (Patient not taking: Reported on 08/03/2023)   No facility-administered medications prior to visit.   Reviewed past medical and social history.   ROS per HPI above      Objective:  BP 113/65 (BP Location: Left Arm, Patient Position: Sitting, Cuff Size: Large)   Pulse 72   Temp 98.2 F (36.8 C) (Temporal)   Resp 18   Ht 5\' 3"  (1.6 m)   Wt 207 lb (93.9 kg)   SpO2 98%   BMI 36.67 kg/m      Physical Exam Vitals and nursing note reviewed.  Constitutional:      Appearance: She is obese.  Cardiovascular:     Rate and Rhythm: Normal rate.     Pulses: Normal pulses.  Pulmonary:     Effort: Pulmonary effort is normal.  Musculoskeletal:        General: Tenderness present.     Right upper leg: Normal.     Right knee: Swelling and crepitus present. No effusion, erythema or ecchymosis. Decreased range of motion. Tenderness present over the lateral joint line. No medial joint line, MCL, LCL or patellar tendon tenderness.     Left knee:  Normal.     Right lower leg: Normal. No edema.     Left lower leg: No edema.     Right ankle: Normal.  Neurological:     Mental Status: She is alert and oriented to person, place, and time.     No results found for any visits on 08/03/23.    Assessment & Plan:    Problem List Items Addressed This Visit     Iron deficiency anemia due to chronic blood loss   Relevant Orders   CBC with Differential/Platelet   Iron, TIBC and Ferritin Panel   US Pelvic Complete With Transvaginal   Lateral knee pain, right - Primary   Relevant Medications   meloxicam (MOBIC) 15 MG tablet   Other Relevant Orders   Ambulatory referral to Sports Medicine   Uric acid   Menorrhagia with irregular cycle    LMP 05/2023 (lasted 10days), 11/11/2022 (lasted 5-6days), no clots, no Dysmenorrhea,  no dyspareunia Has palpable mass suprapubic S/p tubal ligation 49yrs ago  check pelvic US, CBC and iron panel      Relevant Orders   CBC with Differential/Platelet   US Pelvic Complete With Transvaginal   Obesity    Previous weight loss attempt: decreased caloric intake and exercise 2x/week x 47yr. Lost only 3lbs. No tobacco or ALCOHOL use Admits to high caloric diet, sugary drinks, and lack of exercise Wt Readings from Last 3 Encounters:  08/03/23 207 lb (93.9 kg)  03/08/23 218 lb 0.6 oz (98.9 kg)  02/03/22 204 lb 12.8 oz (92.9 kg)    We discussed use of GLP-1 injection vs contrave vs qsymia, their possible side effects and contraindications. Advised about need of diet modification and exercise once knee pain resolves. We agreed to start wegovy if covered by her insurance. She also agreed to nutritionist referral. [facility] 0.25mg  sent Check THYROID, CMP, lipid panel, and hgbA1c F/up in 55month      Relevant Medications   Semaglutide-Weight Management (WEGOVY) 0.25 MG/0.5ML SOAJ   Other Relevant Orders   Amb ref to Medical Nutrition Therapy-MNT   Comprehensive metabolic panel   Hemoglobin A1c   Lipid  panel   TSH   Other Visit Diagnoses     Need for Tdap vaccination       Relevant Orders   Tdap vaccine greater than or equal to 7yo IM (Completed)      Return in about 4 weeks (around 08/31/2023) for Weight management.     Alysia Penna, NP

## 2023-08-03 NOTE — Patient Instructions (Signed)
Apply cold compress to knee as needed Go to lab  Calorie Counting for Weight Loss Calories are units of energy. Your body needs a certain number of calories from food to keep going throughout the day. When you eat or drink more calories than your body needs, your body stores the extra calories mostly as fat. When you eat or drink fewer calories than your body needs, your body burns fat to get the energy it needs. Calorie counting means keeping track of how many calories you eat and drink each day. Calorie counting can be helpful if you need to lose weight. If you eat fewer calories than your body needs, you should lose weight. Ask your health care provider what a healthy weight is for you. For calorie counting to work, you will need to eat the right number of calories each day to lose a healthy amount of weight per week. A dietitian can help you figure out how many calories you need in a day and will suggest ways to reach your calorie goal. A healthy amount of weight to lose each week is usually 1-2 lb (0.5-0.9 kg). This usually means that your daily calorie intake should be reduced by 500-750 calories. Eating 1,200-1,500 calories a day can help most women lose weight. Eating 1,500-1,800 calories a day can help most men lose weight. What do I need to know about calorie counting? Work with your health care provider or dietitian to determine how many calories you should get each day. To meet your daily calorie goal, you will need to: Find out how many calories are in each food that you would like to eat. Try to do this before you eat. Decide how much of the food you plan to eat. Keep a food log. Do this by writing down what you ate and how many calories it had. To successfully lose weight, it is important to balance calorie counting with a healthy lifestyle that includes regular activity. Where do I find calorie information?  The number of calories in a food can be found on a Nutrition Facts label.  If a food does not have a Nutrition Facts label, try to look up the calories online or ask your dietitian for help. Remember that calories are listed per serving. If you choose to have more than one serving of a food, you will have to multiply the calories per serving by the number of servings you plan to eat. For example, the label on a package of bread might say that a serving size is 1 slice and that there are 90 calories in a serving. If you eat 1 slice, you will have eaten 90 calories. If you eat 2 slices, you will have eaten 180 calories. How do I keep a food log? After each time that you eat, record the following in your food log as soon as possible: What you ate. Be sure to include toppings, sauces, and other extras on the food. How much you ate. This can be measured in cups, ounces, or number of items. How many calories were in each food and drink. The total number of calories in the food you ate. Keep your food log near you, such as in a pocket-sized notebook or on an app or website on your mobile phone. Some programs will calculate calories for you and show you how many calories you have left to meet your daily goal. What are some portion-control tips? Know how many calories are in a serving. This will help  you know how many servings you can have of a certain food. Use a measuring cup to measure serving sizes. You could also try weighing out portions on a kitchen scale. With time, you will be able to estimate serving sizes for some foods. Take time to put servings of different foods on your favorite plates or in your favorite bowls and cups so you know what a serving looks like. Try not to eat straight from a food's packaging, such as from a bag or box. Eating straight from the package makes it hard to see how much you are eating and can lead to overeating. Put the amount you would like to eat in a cup or on a plate to make sure you are eating the right portion. Use smaller plates, glasses,  and bowls for smaller portions and to prevent overeating. Try not to multitask. For example, avoid watching TV or using your computer while eating. If it is time to eat, sit down at a table and enjoy your food. This will help you recognize when you are full. It will also help you be more mindful of what and how much you are eating. What are tips for following this plan? Reading food labels Check the calorie count compared with the serving size. The serving size may be smaller than what you are used to eating. Check the source of the calories. Try to choose foods that are high in protein, fiber, and vitamins, and low in saturated fat, trans fat, and sodium. Shopping Read nutrition labels while you shop. This will help you make healthy decisions about which foods to buy. Pay attention to nutrition labels for low-fat or fat-free foods. These foods sometimes have the same number of calories or more calories than the full-fat versions. They also often have added sugar, starch, or salt to make up for flavor that was removed with the fat. Make a grocery list of lower-calorie foods and stick to it. Cooking Try to cook your favorite foods in a healthier way. For example, try baking instead of frying. Use low-fat dairy products. Meal planning Use more fruits and vegetables. One-half of your plate should be fruits and vegetables. Include lean proteins, such as chicken, [place], and fish. Lifestyle Each week, aim to do one of the following: 150 minutes of moderate exercise, such as walking. 75 minutes of vigorous exercise, such as running. General information Know how many calories are in the foods you eat most often. This will help you calculate calorie counts faster. Find a way of tracking calories that works for you. Get creative. Try different apps or programs if writing down calories does not work for you. What foods should I eat?  Eat nutritious foods. It is better to have a nutritious,  high-calorie food, such as an avocado, than a food with few nutrients, such as a bag of potato chips. Use your calories on foods and drinks that will fill you up and will not leave you hungry soon after eating. Examples of foods that fill you up are nuts and nut butters, vegetables, lean proteins, and high-fiber foods such as whole grains. High-fiber foods are foods with more than 5 g of fiber per serving. Pay attention to calories in drinks. Low-calorie drinks include water and unsweetened drinks. The items listed above may not be a complete list of foods and beverages you can eat. Contact a dietitian for more information. What foods should I limit? Limit foods or drinks that are not good sources of vitamins,  minerals, or protein or that are high in unhealthy fats. These include: Candy. Other sweets. Sodas, specialty coffee drinks, alcohol, and juice. The items listed above may not be a complete list of foods and beverages you should avoid. Contact a dietitian for more information. How do I count calories when eating out? Pay attention to portions. Often, portions are much larger when eating out. Try these tips to keep portions smaller: Consider sharing a meal instead of getting your own. If you get your own meal, eat only half of it. Before you start eating, ask for a container and put half of your meal into it. When available, consider ordering smaller portions from the menu instead of full portions. Pay attention to your food and drink choices. Knowing the way food is cooked and what is included with the meal can help you eat fewer calories. If calories are listed on the menu, choose the lower-calorie options. Choose dishes that include vegetables, fruits, whole grains, low-fat dairy products, and lean proteins. Choose items that are boiled, broiled, grilled, or steamed. Avoid items that are buttered, battered, fried, or served with cream sauce. Items labeled as crispy are usually fried,  unless stated otherwise. Choose water, low-fat milk, unsweetened iced tea, or other drinks without added sugar. If you want an alcoholic beverage, choose a lower-calorie option, such as a glass of wine or light beer. Ask for dressings, sauces, and syrups on the side. These are usually high in calories, so you should limit the amount you eat. If you want a salad, choose a garden salad and ask for grilled meats. Avoid extra toppings such as bacon, cheese, or fried items. Ask for the dressing on the side, or ask for olive oil and vinegar or lemon to use as dressing. Estimate how many servings of a food you are given. Knowing serving sizes will help you be aware of how much food you are eating at restaurants. Where to find more information Centers for Disease Control and Prevention: FootballExhibition.com.br U.S. Department of Agriculture: WrestlingReporter.dk Summary Calorie counting means keeping track of how many calories you eat and drink each day. If you eat fewer calories than your body needs, you should lose weight. A healthy amount of weight to lose per week is usually 1-2 lb (0.5-0.9 kg). This usually means reducing your daily calorie intake by 500-750 calories. The number of calories in a food can be found on a Nutrition Facts label. If a food does not have a Nutrition Facts label, try to look up the calories online or ask your dietitian for help. Use smaller plates, glasses, and bowls for smaller portions and to prevent overeating. Use your calories on foods and drinks that will fill you up and not leave you hungry shortly after a meal. This information is not intended to replace advice given to you by your health care provider. Make sure you discuss any questions you have with your health care provider. Document Revised: 09/21/2019 Document Reviewed: 09/21/2019 Elsevier Patient Education  2023 [employer].

## 2023-08-04 ENCOUNTER — Other Ambulatory Visit: Payer: Self-pay | Admitting: Nurse Practitioner

## 2023-08-04 DIAGNOSIS — E66812 Obesity, class 2: Secondary | ICD-10-CM

## 2023-08-04 LAB — IRON,TIBC AND FERRITIN PANEL
%SAT: 22 % (ref 16–45)
Ferritin: 6 ng/mL — ABNORMAL LOW (ref 16–232)
Iron: 85 ug/dL (ref 40–190)
TIBC: 378 ug/dL (ref 250–450)

## 2023-08-04 NOTE — Addendum Note (Signed)
Addended by: Alysia Penna L on: 08/04/2023 04:08 PM   Modules accepted: Orders

## 2023-08-05 ENCOUNTER — Ambulatory Visit
Admission: RE | Admit: 2023-08-05 | Discharge: 2023-08-05 | Disposition: A | Discharge status: Home / Self Care | Payer: 59 | Source: Ambulatory Visit | Attending: Nurse Practitioner | Admitting: Nurse Practitioner

## 2023-08-05 DIAGNOSIS — N921 Excessive and frequent menstruation with irregular cycle: Secondary | ICD-10-CM

## 2023-08-05 DIAGNOSIS — N920 Excessive and frequent menstruation with regular cycle: Secondary | ICD-10-CM | POA: Diagnosis not present

## 2023-08-05 DIAGNOSIS — R9389 Abnormal findings on diagnostic imaging of other specified body structures: Secondary | ICD-10-CM | POA: Diagnosis not present

## 2023-08-05 DIAGNOSIS — D251 Intramural leiomyoma of uterus: Secondary | ICD-10-CM | POA: Diagnosis not present

## 2023-08-05 DIAGNOSIS — D500 Iron deficiency anemia secondary to blood loss (chronic): Secondary | ICD-10-CM

## 2023-08-06 NOTE — Addendum Note (Signed)
Addended by: Alysia Penna L on: 08/06/2023 03:52 PM   Modules accepted: Orders

## 2023-08-10 ENCOUNTER — Ambulatory Visit: Payer: 59 | Admitting: Family Medicine

## 2023-08-10 NOTE — Progress Notes (Deleted)
   Rubin Payor, PhD, LAT, ATC acting as a scribe for [name], MD.  Natalie Orr is a 42 y.o. female who presents to [facility] today for R knee pain ongoing since Nov 22nd. Pain started after doing some exercising w/ her husband. She was seen at [facility] ED the following day. Pt locates pain to ***  R Knee swelling: Mechanical symptoms: Aggravates: Treatments tried:  Dx imaging: 07/17/23 R knee XR  Pertinent review of systems: ***  Relevant historical information: ***   Exam:  There were no vitals taken for this visit. General: Well Developed, well nourished, and in no acute distress.   MSK: ***    Lab and Radiology Results No results found for this or any previous visit (from the past 72 hours). No results found.     Assessment and Plan: 42 y.o. female with ***   PDMP not reviewed this encounter. No orders of the defined types were placed in this encounter.  No orders of the defined types were placed in this encounter.    Discussed warning signs or symptoms. Please see discharge instructions. Patient expresses understanding.   ***

## 2023-08-16 ENCOUNTER — Telehealth: Payer: 59 | Admitting: Physician Assistant

## 2023-08-16 DIAGNOSIS — R8290 Unspecified abnormal findings in urine: Secondary | ICD-10-CM

## 2023-08-16 NOTE — Progress Notes (Signed)
Because the only symptom is a change in the odor of urine which can be from different things (medications, hydration, dietary choices, bv, etc) and absence of other UTI symptoms, I feel your condition warrants further evaluation and I recommend that you be seen in a face to face visit.   NOTE: There will be NO CHARGE for this eVisit   If you are having a true medical emergency please call 911.      For an urgent face to face visit, [place] has eight urgent care centers for your convenience:   NEW!! [facility] at [facility] Get Driving Directions [phone] [address], [address] [place], [address]    [facility] at [facility] Get Driving Directions [phone] [address] [place], [place] [address]   [facility] Center [facility]) Get Driving Directions [phone] [address] [place], [place] [address]  [facility]) Get Driving Directions [phone] [address] [place],  [place]  [address]  [facility] Health Urgent Care Center [facility] - at [employer]  [phone] [address] [place],  [place] [address]   [facility] Health Urgent Care at [facility] Get Driving Directions [phone] [address] [place], [place] [address]   [facility] Health Urgent Care at [facility] Get Driving Directions  [phone] [address] [place], [place] [address]   [facility] Health Urgent Care at [facility] - Inpatient Directions [phone] [address], [address] [place], [place] [address]  Your MyChart E-visit questionnaire answers were reviewed by a board certified advanced clinical practitioner to complete your personal care plan based on your specific symptoms.  Thank you for using e-Visits.

## 2023-08-24 NOTE — Progress Notes (Deleted)
   Rubin Payor, PhD, LAT, ATC acting as a scribe for [name], MD.  Natalie Orr is a 42 y.o. female who presents to [facility] today for R knee pain ongoing since Nov 22nd. Pain started after doing some exercising w/ her husband. She was seen at [facility] ED the following day. Pt locates pain to ***  R Knee swelling: Mechanical symptoms: Aggravates: Treatments tried:  Dx imaging: 07/17/23 R knee XR  Pertinent review of systems: ***  Relevant historical information: ***   Exam:  There were no vitals taken for this visit. General: Well Developed, well nourished, and in no acute distress.   MSK: ***    Lab and Radiology Results No results found for this or any previous visit (from the past 72 hours). No results found.     Assessment and Plan: 42 y.o. female with ***   PDMP not reviewed this encounter. No orders of the defined types were placed in this encounter.  No orders of the defined types were placed in this encounter.    Discussed warning signs or symptoms. Please see discharge instructions. Patient expresses understanding.   ***

## 2023-08-26 ENCOUNTER — Ambulatory Visit: Payer: 59 | Admitting: Family Medicine

## 2023-08-28 ENCOUNTER — Telehealth: Payer: 59 | Admitting: Family Medicine

## 2023-08-28 DIAGNOSIS — R3989 Other symptoms and signs involving the genitourinary system: Secondary | ICD-10-CM | POA: Diagnosis not present

## 2023-08-28 DIAGNOSIS — B3731 Acute candidiasis of vulva and vagina: Secondary | ICD-10-CM

## 2023-08-28 MED ORDER — FLUCONAZOLE 150 MG PO TABS: 150.0000 mg | ORAL_TABLET | Freq: Once | ORAL | 0 refills | Status: AC

## 2023-08-28 MED ORDER — CEPHALEXIN 500 MG PO CAPS: 500.0000 mg | ORAL_CAPSULE | Freq: Two times a day (BID) | ORAL | 0 refills | Status: DC

## 2023-08-28 NOTE — Progress Notes (Signed)

## 2023-08-31 ENCOUNTER — Ambulatory Visit: Payer: 59 | Admitting: Nurse Practitioner

## 2023-09-02 ENCOUNTER — Ambulatory Visit (INDEPENDENT_AMBULATORY_CARE_PROVIDER_SITE_OTHER): Payer: 59 | Admitting: Nurse Practitioner

## 2023-09-02 ENCOUNTER — Encounter: Payer: Self-pay | Admitting: Nurse Practitioner

## 2023-09-02 VITALS — BP 127/64 | HR 73 | Temp 98.2°F | Resp 19 | Ht 63.0 in | Wt 207.2 lb

## 2023-09-02 DIAGNOSIS — E66812 Obesity, class 2: Secondary | ICD-10-CM | POA: Diagnosis not present

## 2023-09-02 DIAGNOSIS — E7800 Pure hypercholesterolemia, unspecified: Secondary | ICD-10-CM | POA: Insufficient documentation

## 2023-09-02 DIAGNOSIS — Z6836 Body mass index (BMI) 36.0-36.9, adult: Secondary | ICD-10-CM | POA: Diagnosis not present

## 2023-09-02 DIAGNOSIS — B353 Tinea pedis: Secondary | ICD-10-CM | POA: Insufficient documentation

## 2023-09-02 MED ORDER — PHENTERMINE HCL 15 MG PO CAPS: 15.0000 mg | ORAL_CAPSULE | ORAL | 0 refills | Status: DC

## 2023-09-02 MED ORDER — TOPIRAMATE 25 MG PO TABS: 25.0000 mg | ORAL_TABLET | Freq: Every day | ORAL | 0 refills | Status: DC

## 2023-09-02 NOTE — Patient Instructions (Addendum)
 Schedule appointment with GYN: [phone] For athlete feet: Use miconazole powder daily Soak feet in vinegar and epsom salt bath daily  Calorie Counting for Weight Loss Calories are units of energy. Your body needs a certain number of calories from food to keep going throughout the day. When you eat or drink more calories than your body needs, your body stores the extra calories mostly as fat. When you eat or drink fewer calories than your body needs, your body burns fat to get the energy it needs. Calorie counting means keeping track of how many calories you eat and drink each day. Calorie counting can be helpful if you need to lose weight. If you eat fewer calories than your body needs, you should lose weight. Ask your health care provider what a healthy weight is for you. For calorie counting to work, you will need to eat the right number of calories each day to lose a healthy amount of weight per week. A dietitian can help you figure out how many calories you need in a day and will suggest ways to reach your calorie goal. A healthy amount of weight to lose each week is usually 1-2 lb (0.5-0.9 kg). This usually means that your daily calorie intake should be reduced by 500-750 calories. Eating 1,200-1,500 calories a day can help most women lose weight. Eating 1,500-1,800 calories a day can help most men lose weight. What do I need to know about calorie counting? Work with your health care provider or dietitian to determine how many calories you should get each day. To meet your daily calorie goal, you will need to: Find out how many calories are in each food that you would like to eat. Try to do this before you eat. Decide how much of the food you plan to eat. Keep a food log. Do this by writing down what you ate and how many calories it had. To successfully lose weight, it is important to balance calorie counting with a healthy lifestyle that includes regular activity. Where do I find calorie  information?  The number of calories in a food can be found on a Nutrition Facts label. If a food does not have a Nutrition Facts label, try to look up the calories online or ask your dietitian for help. Remember that calories are listed per serving. If you choose to have more than one serving of a food, you will have to multiply the calories per serving by the number of servings you plan to eat. For example, the label on a package of bread might say that a serving size is 1 slice and that there are 90 calories in a serving. If you eat 1 slice, you will have eaten 90 calories. If you eat 2 slices, you will have eaten 180 calories. How do I keep a food log? After each time that you eat, record the following in your food log as soon as possible: What you ate. Be sure to include toppings, sauces, and other extras on the food. How much you ate. This can be measured in cups, ounces, or number of items. How many calories were in each food and drink. The total number of calories in the food you ate. Keep your food log near you, such as in a pocket-sized notebook or on an app or website on your mobile phone. Some programs will calculate calories for you and show you how many calories you have left to meet your daily goal. What are some  portion-control tips? Know how many calories are in a serving. This will help you know how many servings you can have of a certain food. Use a measuring cup to measure serving sizes. You could also try weighing out portions on a kitchen scale. With time, you will be able to estimate serving sizes for some foods. Take time to put servings of different foods on your favorite plates or in your favorite bowls and cups so you know what a serving looks like. Try not to eat straight from a food's packaging, such as from a bag or box. Eating straight from the package makes it hard to see how much you are eating and can lead to overeating. Put the amount you would like to eat in a cup  or on a plate to make sure you are eating the right portion. Use smaller plates, glasses, and bowls for smaller portions and to prevent overeating. Try not to multitask. For example, avoid watching TV or using your computer while eating. If it is time to eat, sit down at a table and enjoy your food. This will help you recognize when you are full. It will also help you be more mindful of what and how much you are eating. What are tips for following this plan? Reading food labels Check the calorie count compared with the serving size. The serving size may be smaller than what you are used to eating. Check the source of the calories. Try to choose foods that are high in protein, fiber, and vitamins, and low in saturated fat, trans fat, and sodium. Shopping Read nutrition labels while you shop. This will help you make healthy decisions about which foods to buy. Pay attention to nutrition labels for low-fat or fat-free foods. These foods sometimes have the same number of calories or more calories than the full-fat versions. They also often have added sugar, starch, or salt to make up for flavor that was removed with the fat. Make a grocery list of lower-calorie foods and stick to it. Cooking Try to cook your favorite foods in a healthier way. For example, try baking instead of frying. Use low-fat dairy products. Meal planning Use more fruits and vegetables. One-half of your plate should be fruits and vegetables. Include lean proteins, such as chicken, turkey, and fish. Lifestyle Each week, aim to do one of the following: 150 minutes of moderate exercise, such as walking. 75 minutes of vigorous exercise, such as running. General information Know how many calories are in the foods you eat most often. This will help you calculate calorie counts faster. Find a way of tracking calories that works for you. Get creative. Try different apps or programs if writing down calories does not work for you. What  foods should I eat?  Eat nutritious foods. It is better to have a nutritious, high-calorie food, such as an avocado, than a food with few nutrients, such as a bag of potato chips. Use your calories on foods and drinks that will fill you up and will not leave you hungry soon after eating. Examples of foods that fill you up are nuts and nut butters, vegetables, lean proteins, and high-fiber foods such as whole grains. High-fiber foods are foods with more than 5 g of fiber per serving. Pay attention to calories in drinks. Low-calorie drinks include water and unsweetened drinks. The items listed above may not be a complete list of foods and beverages you can eat. Contact a dietitian for more information. What foods should  I limit? Limit foods or drinks that are not good sources of vitamins, minerals, or protein or that are high in unhealthy fats. These include: Candy. Other sweets. Sodas, specialty coffee drinks, alcohol, and juice. The items listed above may not be a complete list of foods and beverages you should avoid. Contact a dietitian for more information. How do I count calories when eating out? Pay attention to portions. Often, portions are much larger when eating out. Try these tips to keep portions smaller: Consider sharing a meal instead of getting your own. If you get your own meal, eat only half of it. Before you start eating, ask for a container and put half of your meal into it. When available, consider ordering smaller portions from the menu instead of full portions. Pay attention to your food and drink choices. Knowing the way food is cooked and what is included with the meal can help you eat fewer calories. If calories are listed on the menu, choose the lower-calorie options. Choose dishes that include vegetables, fruits, whole grains, low-fat dairy products, and lean proteins. Choose items that are boiled, broiled, grilled, or steamed. Avoid items that are buttered, battered,  fried, or served with cream sauce. Items labeled as crispy are usually fried, unless stated otherwise. Choose water, low-fat milk, unsweetened iced tea, or other drinks without added sugar. If you want an alcoholic beverage, choose a lower-calorie option, such as a glass of wine or light beer. Ask for dressings, sauces, and syrups on the side. These are usually high in calories, so you should limit the amount you eat. If you want a salad, choose a garden salad and ask for grilled meats. Avoid extra toppings such as bacon, cheese, or fried items. Ask for the dressing on the side, or ask for olive oil and vinegar or lemon to use as dressing. Estimate how many servings of a food you are given. Knowing serving sizes will help you be aware of how much food you are eating at restaurants. Where to find more information Centers for Disease Control and Prevention: footballexhibition.com.br U.S. Department of Agriculture: wrestlingreporter.dk Summary Calorie counting means keeping track of how many calories you eat and drink each day. If you eat fewer calories than your body needs, you should lose weight. A healthy amount of weight to lose per week is usually 1-2 lb (0.5-0.9 kg). This usually means reducing your daily calorie intake by 500-750 calories. The number of calories in a food can be found on a Nutrition Facts label. If a food does not have a Nutrition Facts label, try to look up the calories online or ask your dietitian for help. Use smaller plates, glasses, and bowls for smaller portions and to prevent overeating. Use your calories on foods and drinks that will fill you up and not leave you hungry shortly after a meal. This information is not intended to replace advice given to you by your health care provider. Make sure you discuss any questions you have with your health care provider. Document Revised: 09/21/2019 Document Reviewed: 09/21/2019 Elsevier Patient Education  2023 Elsevier Inc.   How to Increase Your  Level of Physical Activity Getting regular physical activity is important for your overall health and well-being. Most people do not get enough exercise. There are easy ways to increase your level of physical activity, even if you have not been very active in the past or if you are just starting out. What are the benefits of physical activity? Physical activity has many  short-term and long-term benefits. Being active on a regular basis can improve your physical and mental health as well as provide other benefits. Physical health benefits Helping you lose weight or maintain a healthy weight. Strengthening your muscles and bones. Reducing your risk of certain long-term (chronic) diseases, including heart disease, cancer, and diabetes. Being able to move around more easily and for longer periods of time without getting tired (increased endurance or stamina). Improving your ability to fight off illness (enhanced immunity). Being able to sleep better. Helping you stay healthy as you get older, including: Helping you stay mobile, or capable of walking and moving around. Preventing accidents, such as falls. Increasing life expectancy. Mental health benefits Boosting your mood and improving your self-esteem. Lowering your chance of having mental health problems, such as depression or anxiety. Helping you feel good about your body. Other benefits Finding new sources of fun and enjoyment. Meeting new people who share a common interest. Before you begin If you have a chronic illness or have not been active for a while, check with your health care provider about how to get started. Ask your health care provider what activities are safe for you. Start out slowly. Walking or doing some simple chair exercises is a good place to start, especially if you have not been active before or for a long time. Set goals that you can work toward. Ask your health care provider how much exercise is best for you. In  general, most adults should: Do moderate-intensity exercise for at least 150 minutes each week (30 minutes on most days of the week) or vigorous exercise for at least 75 minutes each week, or a combination of these. Moderate-intensity exercise can include walking at a quick pace, biking, yoga, water aerobics, or gardening. Vigorous exercise involves activities that take more effort, such as jogging or running, playing sports, swimming laps, or jumping rope. Do strength exercises on at least 2 days each week. This can include weight lifting, body weight exercises, and resistance-band exercises. How to be more physically active Make a plan  Try to find activities that you enjoy. You are more likely to commit to an exercise routine if it does not feel like a chore. If you have bone or joint problems, choose low-impact exercises, like walking or swimming. Use these tips for being successful with an exercise plan: Find a workout partner for accountability. Join a group or class, such as an aerobics class, cycling class, or sports team. Make family time active. Go for a walk, bike, or swim. Include a variety of exercises each week. Consider using a fitness tracker, such as a mobile phone app or a device worn like a watch, that will count the number of steps you take each day. Many people strive to reach 10,000 steps a day. Find ways to be active in your daily routines Besides your formal exercise plans, you can find ways to do physical activity during your daily routines, such as: Walking or biking to work or to the store. Taking the stairs instead of the elevator. Parking farther away from the door at work or at the store. Planning walking meetings. Walking around while you are on the phone. Where to find more information Centers for Disease Control and Prevention: campuscasting.com.pt President's Council on Fitness, Sports & Nutrition: www.fitness.gov ChooseMyPlate:  http://www.harvey.com/ Contact a health care provider if: You have headaches, muscle aches, or joint pain that is concerning. You feel dizzy or light-headed while exercising. You faint. You feel your  heart skipping, racing, or fluttering. You have chest pain while exercising. Summary Exercise benefits your mind and body at any age, even if you are just starting out. If you have a chronic illness or have not been active for a while, check with your health care provider before increasing your physical activity. Choose activities that are safe and enjoyable for you. Ask your health care provider what activities are safe for you. Start slowly. Tell your health care provider if you have problems as you start to increase your activity level. This information is not intended to replace advice given to you by your health care provider. Make sure you discuss any questions you have with your health care provider. Document Revised: 12/06/2020 Document Reviewed: 12/06/2020 Elsevier Patient Education  2024 [employer].

## 2023-09-02 NOTE — Assessment & Plan Note (Addendum)
 Unable to afford wegovy . She agreed to start phentermine  15mg  and topiramate  25mg  since unable to afford Qsymia. S/p tubal ligation No Hx of seizure Wt Readings from Last 3 Encounters:  09/02/23 207 lb 3.2 oz (94 kg)  08/03/23 207 lb (93.9 kg)  03/08/23 218 lb 0.6 oz (98.9 kg)    Advised about importance of heart healthy diet, small meal portions, and daily exercise. Provided printed information F/up in 92month

## 2023-09-02 NOTE — Progress Notes (Signed)
 Established Patient Visit  Patient: Natalie Orr   DOB: 1981/08/23   43 y.o. Female  MRN: [id number] Visit Date: 09/02/2023  Subjective:    Chief Complaint  Patient presents with   Weight Managment     1 month follow up    HPI Obesity Unable to afford wegovy . She agreed to start phentermine  15mg  and topiramate  25mg  since unable to afford Qsymia. S/p tubal ligation No Hx of seizure Wt Readings from Last 3 Encounters:  09/02/23 207 lb 3.2 oz (94 kg)  08/03/23 207 lb (93.9 kg)  03/08/23 218 lb 0.6 oz (98.9 kg)    Advised about importance of heart healthy diet, small meal portions, and daily exercise. Provided printed information F/up in 56month  Reviewed medical, surgical, and social history today  Medications: Outpatient Medications Prior to Visit  Medication Sig   fluticasone  (FLONASE ) 50 MCG/ACT nasal spray Place 2 sprays into both nostrils daily.   Iron , Ferrous Sulfate , 325 (65 Fe) MG TABS Take 325 mg by mouth daily.   meloxicam  (MOBIC ) 15 MG tablet Take 1 tablet (15 mg total) by mouth daily. With food   [DISCONTINUED] cephALEXin  (KEFLEX ) 500 MG capsule Take 1 capsule (500 mg total) by mouth 2 (two) times daily for 7 days.   [DISCONTINUED] fluconazole  (DIFLUCAN ) 150 MG tablet Take 150 mg by mouth once.   [DISCONTINUED] Semaglutide -Weight Management (WEGOVY ) 0.25 MG/0.5ML SOAJ Inject 0.25 mg into the skin once a week.   No facility-administered medications prior to visit.   Reviewed past medical and social history.   ROS per HPI above  Last metabolic panel Lab Results  Component Value Date   GLUCOSE 68 (L) 08/03/2023   NA 140 08/03/2023   K 4.4 08/03/2023   CL 103 08/03/2023   CO2 31 08/03/2023   BUN 10 08/03/2023   CREATININE 0.67 08/03/2023   GFR 107.85 08/03/2023   CALCIUM 9.4 08/03/2023   PROT 7.6 08/03/2023   ALBUMIN 4.2 08/03/2023   BILITOT 0.5 08/03/2023   ALKPHOS 116 08/03/2023   AST 16 08/03/2023   ALT 10 08/03/2023   ANIONGAP  7 09/01/2016   Last lipids Lab Results  Component Value Date   CHOL 210 (H) 08/03/2023   HDL 52.50 08/03/2023   LDLCALC 139 (H) 08/03/2023   TRIG 91.0 08/03/2023   CHOLHDL 4 08/03/2023   Last hemoglobin A1c Lab Results  Component Value Date   HGBA1C 5.3 08/03/2023   Last thyroid functions Lab Results  Component Value Date   TSH 0.96 08/03/2023      Objective:  BP 127/64 (BP Location: Left Arm, Patient Position: Sitting, Cuff Size: Large)   Pulse 73   Temp 98.2 F (36.8 C) (Temporal)   Resp 19   Ht 5' 3 (1.6 m)   Wt 207 lb 3.2 oz (94 kg)   LMP 06/24/2023 (Within Months)   SpO2 100%   BMI 36.70 kg/m      Physical Exam Vitals and nursing note reviewed.  Cardiovascular:     Rate and Rhythm: Normal rate and regular rhythm.     Pulses: Normal pulses.     Heart sounds: Normal heart sounds.  Pulmonary:     Effort: Pulmonary effort is normal.     Breath sounds: Normal breath sounds.  Neurological:     Mental Status: She is alert and oriented to person, place, and time.     No results found for any  visits on 09/02/23.    Assessment & Plan:    Problem List Items Addressed This Visit     Obesity - Primary   Unable to afford wegovy . She agreed to start phentermine  15mg  and topiramate  25mg  since unable to afford Qsymia. S/p tubal ligation No Hx of seizure Wt Readings from Last 3 Encounters:  09/02/23 207 lb 3.2 oz (94 kg)  08/03/23 207 lb (93.9 kg)  03/08/23 218 lb 0.6 oz (98.9 kg)    Advised about importance of heart healthy diet, small meal portions, and daily exercise. Provided printed information F/up in 68month      Relevant Medications   phentermine  15 MG capsule   topiramate  (TOPAMAX ) 25 MG tablet   Pure hypercholesterolemia   Relevant Medications   phentermine  15 MG capsule   topiramate  (TOPAMAX ) 25 MG tablet   Tinea pedis of both feet   Return in about 4 weeks (around 09/30/2023) for Weight management, hyperlipidemia (fasting).      Roselie Mood, NP

## 2023-09-03 ENCOUNTER — Telehealth: Payer: Self-pay | Admitting: Nurse Practitioner

## 2023-09-03 NOTE — Telephone Encounter (Signed)
 03/22/2023 no show 08/31/2023 late arrival  Pt was seen 09/02/2023. Final warning letter sent via mail and mychart.

## 2023-09-15 ENCOUNTER — Ambulatory Visit: Payer: 59 | Admitting: Family Medicine

## 2023-09-15 NOTE — Progress Notes (Deleted)
   Rubin Payor, PhD, LAT, ATC acting as a scribe for [name], MD.  Natalie Orr is a 43 y.o. female who presents to [facility] today for R knee pain ongoing since Nov 22nd. Pain started after doing some exercising w/ her husband. She was seen at [facility] ED the following day. Pt locates pain to ***  R Knee swelling: Mechanical symptoms: Aggravates: Treatments tried:  Dx imaging: 07/17/23 R knee XR  Pertinent review of systems: ***  Relevant historical information: ***   Exam:  LMP 06/24/2023 (Within Months)  General: Well Developed, well nourished, and in no acute distress.   MSK: ***    Lab and Radiology Results No results found for this or any previous visit (from the past 72 hours). No results found.     Assessment and Plan: 43 y.o. female with ***   PDMP not reviewed this encounter. No orders of the defined types were placed in this encounter.  No orders of the defined types were placed in this encounter.    Discussed warning signs or symptoms. Please see discharge instructions. Patient expresses understanding.   ***

## 2023-09-24 ENCOUNTER — Emergency Department
Admission: EM | Admit: 2023-09-24 | Discharge: 2023-09-24 | Disposition: A | Discharge status: Home / Self Care | Payer: 59 | Attending: Student in an Organized Health Care Education/Training Program | Admitting: Student in an Organized Health Care Education/Training Program

## 2023-09-24 ENCOUNTER — Other Ambulatory Visit: Payer: Self-pay | Admitting: Nurse Practitioner

## 2023-09-24 ENCOUNTER — Other Ambulatory Visit: Payer: Self-pay

## 2023-09-24 ENCOUNTER — Emergency Department: Payer: 59

## 2023-09-24 DIAGNOSIS — E7800 Pure hypercholesterolemia, unspecified: Secondary | ICD-10-CM

## 2023-09-24 DIAGNOSIS — Z20822 Contact with and (suspected) exposure to covid-19: Secondary | ICD-10-CM | POA: Insufficient documentation

## 2023-09-24 DIAGNOSIS — J205 Acute bronchitis due to respiratory syncytial virus: Secondary | ICD-10-CM | POA: Diagnosis not present

## 2023-09-24 DIAGNOSIS — R059 Cough, unspecified: Secondary | ICD-10-CM | POA: Diagnosis not present

## 2023-09-24 DIAGNOSIS — R051 Acute cough: Secondary | ICD-10-CM | POA: Diagnosis not present

## 2023-09-24 LAB — RESP PANEL BY RT-PCR (RSV, FLU A&B, COVID)  RVPGX2
Influenza A by PCR: NEGATIVE
Influenza B by PCR: NEGATIVE
Resp Syncytial Virus by PCR: POSITIVE — AB
SARS Coronavirus 2 by RT PCR: NEGATIVE

## 2023-09-24 MED ORDER — ALBUTEROL SULFATE HFA 108 (90 BASE) MCG/ACT IN AERS
2.0000 | INHALATION_SPRAY | Freq: Four times a day (QID) | RESPIRATORY_TRACT | 2 refills | Status: AC | PRN
Start: 2023-09-24 — End: ?

## 2023-09-24 MED ORDER — IPRATROPIUM-ALBUTEROL 0.5-2.5 (3) MG/3ML IN SOLN
3.0000 mL | Freq: Once | RESPIRATORY_TRACT | Status: AC
  Administered 2023-09-24: 3 mL via RESPIRATORY_TRACT
  Filled 2023-09-24: qty 3

## 2023-09-24 NOTE — ED Triage Notes (Signed)
Pt sts that she has been cough for the last four days. Pt sts that since than she has been starting to having a wheeze. Pt sts that she is also a little SOB.

## 2023-09-24 NOTE — ED Provider Notes (Signed)
[facility] Provider Note    Event Date/Time   First MD Initiated Contact with Patient 09/24/23 1028     (approximate)   History   Cough   HPI  Natalie Orr is a 43 y.o. female who presents to the ER for evaluation of 3 days of cough congestion some shortness of breath.  States that she is occasionally feeling some wheezing denies any history of asthma or bronchitis.  Has had some fevers and chills.  She denies any chest pain or pressure.     Physical Exam   Triage Vital Signs: ED Triage Vitals  Encounter Vitals Group     BP 09/24/23 0844 130/60     Systolic BP Percentile --      Diastolic BP Percentile --      Pulse Rate 09/24/23 0844 67     Resp 09/24/23 0844 18     Temp 09/24/23 0844 97.9 F (36.6 C)     Temp Source 09/24/23 0844 Oral     SpO2 09/24/23 0844 100 %     Weight 09/24/23 0845 207 lb (93.9 kg)     Height 09/24/23 0845 4\' 11"  (1.499 m)     Head Circumference --      Peak Flow --      Pain Score 09/24/23 0845 2     Pain Loc --      Pain Education --      Exclude from Growth Chart --     Most recent vital signs: Vitals:   09/24/23 0844  BP: 130/60  Pulse: 67  Resp: 18  Temp: 97.9 F (36.6 C)  SpO2: 100%     Constitutional: Alert  Eyes: Conjunctivae are normal.  Head: Atraumatic. Nose: No congestion/rhinnorhea. Mouth/Throat: Mucous membranes are moist.   Neck: Painless ROM.  Cardiovascular:   Good peripheral circulation. Respiratory: Normal respiratory effort.  No retractions.  No significant wheeze.  Coarse breath sounds posteriorly. Gastrointestinal: Soft and nontender.  Musculoskeletal:  no deformity Neurologic:  MAE spontaneously. No gross focal neurologic deficits are appreciated.  Skin:  Skin is warm, dry and intact. No rash noted. Psychiatric: Mood and affect are normal. Speech and behavior are normal.    ED Results / Procedures / Treatments   Labs (all labs ordered are listed, but only  abnormal results are displayed) Labs Reviewed  RESP PANEL BY RT-PCR (RSV, FLU A&B, COVID)  RVPGX2 - Abnormal; Notable for the following components:      Result Value   Resp Syncytial Virus by PCR POSITIVE (*)    All other components within normal limits     EKG     RADIOLOGY Please see ED Course for my review and interpretation.  I personally reviewed all radiographic images ordered to evaluate for the above acute complaints and reviewed radiology reports and findings.  These findings were personally discussed with the patient.  Please see medical record for radiology report.    PROCEDURES:  Critical Care performed: No  Procedures   MEDICATIONS ORDERED IN ED: Medications  ipratropium-albuterol (DUONEB) 0.5-2.5 (3) MG/3ML nebulizer solution 3 mL (3 mLs Nebulization Given 09/24/23 1055)     IMPRESSION / MDM / ASSESSMENT AND PLAN / ED COURSE  I reviewed the triage vital signs and the nursing notes.                              Differential diagnosis includes, but is not limited  to, RSV, flu, pneumonia, asthma, PE  Patient presenting to the ER for evaluation of symptoms as described above.  Patient's presentation is consistent with RSV bronchitis.  No significant wheezing or respiratory distress.  Patient appears well and nontoxic.  Chest x-ray on my review and interpretation without evidence of consolidation or effusion.  Does appear stable and appropriate for further management as an outpatient do not feel that further diagnostic testing clinically indicated on an emergent basis.  We discussed signs and symptoms for which she should return to the ER.        FINAL CLINICAL IMPRESSION(S) / ED DIAGNOSES   Final diagnoses:  Acute cough  RSV bronchitis     Rx / DC Orders   ED Discharge Orders          Ordered    albuterol (VENTOLIN HFA) 108 (90 Base) MCG/ACT inhaler  Every 6 hours PRN        09/24/23 1038             Note:  This document was prepared  using Dragon voice recognition software and may include unintentional dictation errors.    Willy Eddy, MD 09/24/23 1101

## 2023-09-24 NOTE — ED Provider Triage Note (Signed)
Emergency Medicine Provider Triage Evaluation Note  Natalie Orr , a 43 y.o. female  was evaluated in triage.  Pt complains of cough x 4 days.  Review of Systems  Positive: Cough, wheezing Negative: No n, v, d.  Physical Exam  BP 130/60 (BP Location: Left Arm)   Pulse 67   Temp 97.9 F (36.6 C) (Oral)   Resp 18   LMP 06/24/2023 (Within Months)   SpO2 100%  Gen:   Awake, no distress   Resp:  Normal effort   cough +  rare wheeze noted MSK:   Moves extremities without difficulty  Other:    Medical Decision Making  Medically screening exam initiated at 8:45 AM.  Appropriate orders placed.  Natalie Orr was informed that the remainder of the evaluation will be completed by another provider, this initial triage assessment does not replace that evaluation, and the importance of remaining in the ED until their evaluation is complete.     Tommi Rumps, PA-C 09/24/23 [phone]

## 2023-09-30 ENCOUNTER — Other Ambulatory Visit (HOSPITAL_BASED_OUTPATIENT_CLINIC_OR_DEPARTMENT_OTHER): Payer: Self-pay | Admitting: Obstetrics & Gynecology

## 2023-09-30 ENCOUNTER — Other Ambulatory Visit (HOSPITAL_BASED_OUTPATIENT_CLINIC_OR_DEPARTMENT_OTHER): Payer: Self-pay | Admitting: Certified Nurse Midwife

## 2024-01-12 ENCOUNTER — Encounter: Payer: Self-pay | Admitting: Nurse Practitioner

## 2024-01-18 NOTE — Telephone Encounter (Signed)
 Called patient and informed her of why I was calling. Patient is now scheduled for an appointment on 02/07/24 at 1 PM. I explained to patient that she has to be able to take her blood pressure and weight for this appointment at home if not she will need to get scheduled for a different day where she is able to come into the office. Patient verbalized that she has a blood pressure machine and a scale at home. Also informed patient that I or someone will be giving her a call at least 10-15 minutes before to go over medications and get her vital signs (BP, HR and Weight) like if she were in the office and to update her if there are any delays with her appointment. She thanked me for calling and all (if any) questions were answered.

## 2024-02-07 ENCOUNTER — Telehealth (INDEPENDENT_AMBULATORY_CARE_PROVIDER_SITE_OTHER): Admitting: Nurse Practitioner

## 2024-02-07 ENCOUNTER — Encounter: Payer: Self-pay | Admitting: Nurse Practitioner

## 2024-02-07 VITALS — BP 114/92 | HR 86 | Wt 205.4 lb

## 2024-02-07 DIAGNOSIS — D500 Iron deficiency anemia secondary to blood loss (chronic): Secondary | ICD-10-CM

## 2024-02-07 DIAGNOSIS — E66813 Obesity, class 3: Secondary | ICD-10-CM

## 2024-02-07 DIAGNOSIS — M25561 Pain in right knee: Secondary | ICD-10-CM | POA: Diagnosis not present

## 2024-02-07 DIAGNOSIS — E7800 Pure hypercholesterolemia, unspecified: Secondary | ICD-10-CM

## 2024-02-07 DIAGNOSIS — Z6841 Body Mass Index (BMI) 40.0 and over, adult: Secondary | ICD-10-CM

## 2024-02-07 DIAGNOSIS — N921 Excessive and frequent menstruation with irregular cycle: Secondary | ICD-10-CM

## 2024-02-07 MED ORDER — MELOXICAM 15 MG PO TABS: 15.0000 mg | ORAL_TABLET | Freq: Every day | ORAL | 1 refills | Status: AC

## 2024-02-07 NOTE — Patient Instructions (Addendum)
 Schedule appointment with GYN and sports medicine. Use meloxicam  for knee pain. Avoid all OVER THE COUNTER NSAIDs (aleve , advil , ibuprofen , naproxen , motrin , aspirin) while taking this med. Do not recommend use of an appetite suppressant at this time due to already limited caloric intake. Maintain of moderate intensity exercise daily. Schedule appointment with nutritionist. Schedule fasting lab appointment. F/up with me in 3months.  Mediterranean Diet A Mediterranean diet is based on the traditions of countries on the [employer]. It focuses on eating more: Fruits and vegetables. Whole grains, beans, nuts, and seeds. Heart-healthy fats. These are fats that are good for your heart. It involves eating less: Dairy. Meat and eggs. Processed foods with added sugar, salt, and fat. This type of diet can help prevent certain conditions. It can also improve outcomes if you have a long-term (chronic) disease, such as kidney or heart disease. What are tips for following this plan? Reading food labels Check packaged foods for: The serving size. For foods such as rice and pasta, the serving size is the amount of cooked product, not dry. The total fat. Avoid foods with saturated fat or trans fat. Added sugars, such as corn syrup. Shopping  Try to have a balanced diet. Buy a variety of foods, such as: Fresh fruits and vegetables. You may be able to get these from local farmers markets. You can also buy them frozen. Grains, beans, nuts, and seeds. Some of these can be bought in bulk. Fresh seafood. Poultry and eggs. Low-fat dairy products. Buy whole ingredients instead of foods that have already been packaged. If you can't get fresh seafood, buy precooked frozen shrimp or canned fish, such as tuna, salmon, or sardines. Stock your pantry so you always have certain foods on hand, such as olive oil, canned tuna, canned tomatoes, rice, pasta, and beans. Cooking Cook foods with  extra-virgin olive oil instead of using butter or other vegetable oils. Have meat as a side dish. Have vegetables or grains as your main dish. This means having meat in small portions or adding small amounts of meat to foods like pasta or stew. Use beans or vegetables instead of meat in common dishes like chili or lasagna. Try out different cooking methods. Try roasting, broiling, steaming, and sauting vegetables. Add frozen vegetables to soups, stews, pasta, or rice. Add nuts or seeds for added healthy fats and plant protein at each meal. You can add these to yogurt, salads, or vegetable dishes. Marinate fish or vegetables using olive oil, lemon juice, garlic, and fresh herbs. Meal planning Plan to eat a vegetarian meal one day each week. Try to work up to two vegetarian meals, if possible. Eat seafood two or more times a week. Have healthy snacks on hand. These may include: Vegetable sticks with hummus. Greek yogurt. Fruit and nut trail mix. Eat balanced meals. These should include: Fruit: 2-3 servings a day. Vegetables: 4-5 servings a day. Low-fat dairy: 2 servings a day. Fish, poultry, or lean meat: 1 serving a day. Beans and legumes: 2 or more servings a week. Nuts and seeds: 1-2 servings a day. Whole grains: 6-8 servings a day. Extra-virgin olive oil: 3-4 servings a day. Limit red meat and sweets to just a few servings a month. Lifestyle  Try to cook and eat meals with your family. Drink enough fluid to keep your pee (urine) pale yellow. Be active every day. This includes: Aerobic exercise, which is exercise that causes your heart to beat faster. Examples include running and swimming. Leisure activities  like gardening, walking, or housework. Get 7-8 hours of sleep each night. Drink red wine if your provider says you can. A glass of wine is 5 oz (150 mL). You may be allowed to have: Up to 1 glass a day if you're female and not pregnant. Up to 2 glasses a day if you're  female. What foods should I eat? Fruits Apples. Apricots. Avocado. Berries. Bananas. Cherries. Dates. Figs. Grapes. Lemons. Melon. Oranges. Peaches. Plums. Pomegranate. Vegetables Artichokes. Beets. Broccoli. Cabbage. Carrots. Eggplant. Green beans. Chard. Kale. Spinach. Onions. Leeks. Peas. Squash. Tomatoes. Peppers. Radishes. Grains Whole-grain pasta. Brown rice. Bulgur wheat. Polenta. Couscous. Whole-wheat bread. Dwyane Glad. Meats and other proteins Beans. Almonds. Sunflower seeds. Pine nuts. Peanuts. Cod. Salmon. Scallops. Shrimp. Tuna. Tilapia. Clams. Oysters. Eggs. Chicken or [place] without skin. Dairy Low-fat milk. Cheese. Greek yogurt. Fats and oils Extra-virgin olive oil. Avocado oil. Grapeseed oil. Beverages Water. Red wine. Herbal tea. Sweets and desserts Greek yogurt with honey. Baked apples. Poached pears. Trail mix. Seasonings and condiments Basil. Cilantro. Coriander. Cumin. Mint. Parsley. Sage. Rosemary. Tarragon. Garlic. Oregano. Thyme. Pepper. Balsamic vinegar. Tahini. Hummus. Tomato sauce. Olives. Mushrooms. The items listed above may not be all the foods and drinks you can have. Talk to a dietitian to learn more. What foods should I limit? This is a list of foods that should be eaten rarely. Fruits Fruit canned in syrup. Vegetables Deep-fried potatoes, like [place] fries. Grains Packaged pasta or rice dishes. Cereal with added sugar. Snacks with added sugar. Meats and other proteins Beef. Pork. Lamb. Chicken or [place] with skin. Hot dogs. Helene Loader. Dairy Ice cream. Sour cream. Whole milk. Fats and oils Butter. Canola oil. Vegetable oil. Beef fat (tallow). Lard. Beverages Juice. Sugar-sweetened soft drinks. Beer. Liquor and spirits. Sweets and desserts Cookies. Cakes. Pies. Candy. Seasonings and condiments Mayonnaise. Pre-made sauces and marinades. The items listed above may not be all the foods and drinks you should limit. Talk to a dietitian to learn  more. Where to find more information American Heart Association (AHA): heart.org This information is not intended to replace advice given to you by your health care provider. Make sure you discuss any questions you have with your health care provider. Document Revised: 11/22/2022 Document Reviewed: 11/22/2022 Elsevier Patient Education  2024 [employer].

## 2024-02-07 NOTE — Assessment & Plan Note (Addendum)
 Took phentermine  15mg  and topamax  25mg  daily x 30days 5months ago. Med was discontinued due to lack of f/up. 2Lbs weight loss noted today. Exercise: walks 3x/week, 1hr each time Diet: per day due to work schedule BP Readings from Last 3 Encounters:  02/07/24 (!) 114/92  09/24/23 130/60  09/02/23 127/64    Wt Readings from Last 3 Encounters:  02/07/24 205 lb 6.4 oz (93.2 kg)  09/24/23 207 lb (93.9 kg)  09/02/23 207 lb 3.2 oz (94 kg)    Do not recommend use of an appetitte suppressant at this time. I recommend maintain at least 2 balanced heart healthy meals daily and of low impact exercise weekly. Encouraged to schedule appointment with nutritionist. F/up in 3months

## 2024-02-07 NOTE — Assessment & Plan Note (Addendum)
 LMP 10/08/2023, 05/2023 (lasted 10days), 11/11/2022 (lasted 5-6days), no clots, no Dysmenorrhea,  no dyspareunia Has palpable mass suprapubic S/p tubal ligation 43yrs ago Normal TSH CBC and iron  panel indicates iron  deficient anemia. Currently takes oral iron  supplement daily. Pelvic US  07/2023: Endometrial stripe within normal limits measuring 5 mm in thickness. 4.6 cm intramural fibroid positioned at the right uterine fundus. Normal sonographic appearance of the ovaries. No adnexal mass or free fluid.  Advised to schedule appointment with GYN as previously recommended. Repeat CBC and iron  panel. Advised to schedule fasting lab appt

## 2024-02-07 NOTE — Progress Notes (Signed)
 Virtual Visit via Video Note  I connected with Natalie Orr on 02/07/24 at  1:00 PM EDT by a video enabled telemedicine application and verified that I am speaking with the correct person using two identifiers.  Location: Patient:Home Provider: Office Participants: patient and provider  I discussed the limitations of evaluation and management by telemedicine and the availability of in person appointments. I also discussed with the patient that there may be a patient responsible charge related to this service. The patient expressed understanding and agreed to proceed.  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 Currently takes oral iron  supplement daily. Pelvic US  07/2023: Endometrial stripe within normal limits measuring 5 mm in thickness. 4.6 cm intramural fibroid positioned at the right uterine fundus. Normal sonographic appearance of the ovaries. No adnexal mass or free fluid.  Advised to schedule appointment with GYN as previously recommended. Repeat CBC and iron  panel. Advised to schedule fasting lab appt  Obesity Took phentermine  15mg  and topamax  25mg  daily x 30days 5months ago. Med was discontinued due to lack of f/up. 2Lbs weight loss noted today. Exercise: walks 3x/week, 1hr each time Diet: per day due to work schedule BP Readings from Last 3 Encounters:  02/07/24  (!) 114/92  09/24/23 130/60  09/02/23 127/64    Wt Readings from Last 3 Encounters:  02/07/24 205 lb 6.4 oz (93.2 kg)  09/24/23 207 lb (93.9 kg)  09/02/23 207 lb 3.2 oz (94 kg)    Do not recommend use of an appetitte suppressant at this time. I recommend maintain at least 2 balanced heart healthy meals daily and of low impact exercise weekly. Encouraged to schedule appointment with nutritionist. F/up in 3months   Observations/Objective: Physical Exam Constitutional:      Appearance: She is obese.   Cardiovascular:     Rate and Rhythm: Normal rate.  Pulmonary:     Effort: Pulmonary effort is normal.   Neurological:     Mental Status: She is alert and oriented to person, place, and time.   Psychiatric:        Mood and Affect: Mood normal.        Behavior: Behavior normal.        Thought Content: Thought content normal.     Assessment and Plan: [name] was seen today for follow-up.  Diagnoses and all orders for this visit:  Class 3 severe obesity due to excess calories with serious comorbidity and body mass index (BMI) of 40.0 to 44.9 in adult -     VITAMIN D 25 Hydroxy (Vit-D Deficiency, Fractures); Future  Iron  deficiency anemia due to chronic blood loss -     CBC; Future  Lateral knee pain, right -     meloxicam  (MOBIC ) 15 MG tablet; Take 1 tablet (15 mg total) by mouth daily. With food  Pure hypercholesterolemia -     Lipid panel; Future  Menorrhagia with irregular cycle   Follow Up Instructions: See instructions above   I  discussed the assessment and treatment plan with the patient. The patient was provided an opportunity to ask questions and all were answered. The patient agreed with the plan and demonstrated an understanding of the instructions.   The patient was advised to call back or seek an in-person evaluation if the symptoms worsen or if the condition fails to improve as anticipated.  Kathrene Parents, NP

## 2024-02-07 NOTE — Assessment & Plan Note (Addendum)
 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

## 2024-03-17 ENCOUNTER — Ambulatory Visit (INDEPENDENT_AMBULATORY_CARE_PROVIDER_SITE_OTHER): Payer: Self-pay | Admitting: Radiology

## 2024-03-17 ENCOUNTER — Encounter: Payer: Self-pay | Admitting: Radiology

## 2024-03-17 VITALS — BP 108/70 | HR 69 | Ht 59.5 in | Wt 203.0 lb

## 2024-03-17 DIAGNOSIS — N760 Acute vaginitis: Secondary | ICD-10-CM

## 2024-03-17 DIAGNOSIS — A599 Trichomoniasis, unspecified: Secondary | ICD-10-CM | POA: Diagnosis not present

## 2024-03-17 DIAGNOSIS — N921 Excessive and frequent menstruation with irregular cycle: Secondary | ICD-10-CM | POA: Diagnosis not present

## 2024-03-17 DIAGNOSIS — D219 Benign neoplasm of connective and other soft tissue, unspecified: Secondary | ICD-10-CM

## 2024-03-17 DIAGNOSIS — D500 Iron deficiency anemia secondary to blood loss (chronic): Secondary | ICD-10-CM | POA: Diagnosis not present

## 2024-03-17 DIAGNOSIS — Z113 Encounter for screening for infections with a predominantly sexual mode of transmission: Secondary | ICD-10-CM

## 2024-03-17 LAB — WET PREP FOR TRICH, YEAST, CLUE

## 2024-03-17 MED ORDER — MYFEMBREE 40-1-0.5 MG PO TABS: 1.0000 | ORAL_TABLET | Freq: Every day | ORAL | 2 refills | Status: DC

## 2024-03-17 MED ORDER — METRONIDAZOLE 500 MG PO TABS: 500.0000 mg | ORAL_TABLET | Freq: Two times a day (BID) | ORAL | 0 refills | Status: DC

## 2024-03-17 NOTE — Patient Instructions (Signed)

## 2024-03-17 NOTE — Addendum Note (Signed)
 Addended by: Darl Brisbin on: 03/17/2024 10:48 AM   Modules accepted: Orders

## 2024-03-17 NOTE — Progress Notes (Addendum)
 Natalie Orr 1980-11-01 [id number]   History:  43 y.o. Referred from [facility] for menorrhagia with irregular cycle, anemia, uterine fibroids Pelvic ultrasound with transvaginal 08/05/23 Complains of heavy, irregular periods x's few years (periods last a few weeks, 2 periods in the last year) Also c/o vaginal discharge, thinks she has BV.  Gynecologic History Patient's last menstrual period was 02/22/2024 (approximate). Period Cycle (Days):  (skips periods, has 2 per year) Period Duration (Days): 14 Period Pattern: (!) Irregular Menstrual Flow: Heavy Menstrual Control: Thin pad, Maxi pad Dysmenorrhea: (!) Moderate Dysmenorrhea Symptoms: Cramping Contraception/Family planning: tubal ligation   Obstetric History OB History  Gravida Para Term Preterm AB Living  14 6    6   SAB IAB Ectopic Multiple Live Births      6    # Outcome Date GA Lbr Len/2nd Weight Sex Type Anes PTL Lv  14 Gravida           13 Gravida           12 Gravida           11 Gravida           10 Gravida           9 Gravida           8 Gravida           7 Gravida           6 Para           5 Para           4 Para           3 Para           2 Para           1 Para                The following portions of the patient's history were reviewed and updated as appropriate: allergies, current medications, past family history, past medical history, past social history, past surgical history, and problem list.  Review of Systems  All other systems reviewed and are negative.   Past medical history, past surgical history, family history and social history were all reviewed and documented in the EPIC chart.  Exam:  Vitals:   03/17/24 0951  BP: 108/70  Pulse: 69  SpO2: 96%  Weight: 203 lb (92.1 kg)  Height: 4' 11.5 (1.511 m)   Body mass index is 40.31 kg/m.  Physical Exam Vitals and nursing note reviewed. Exam conducted with a chaperone present.  Constitutional:      Appearance: Normal appearance.  She is well-developed.  Pulmonary:     Effort: Pulmonary effort is normal.  Abdominal:     General: Abdomen is flat.     Palpations: Abdomen is soft.  Genitourinary:    General: Normal vulva.     Vagina: Vaginal discharge present. No erythema, bleeding or lesions.     Cervix: Normal. No discharge, friability, lesion or erythema.     Uterus: Normal. Enlarged.      Adnexa: Right adnexa normal and left adnexa normal.  Neurological:     Mental Status: She is alert.  Psychiatric:        Mood and Affect: Mood normal.        Thought Content: Thought content normal.        Judgment: Judgment normal.     Darice Hoit, CMA present for exam   Narrative & Impression  CLINICAL  DATA:  Initial evaluation for menorrhagia with irregular cycle. History of tubal ligation.   EXAM: TRANSABDOMINAL AND TRANSVAGINAL ULTRASOUND OF PELVIS   TECHNIQUE: Both transabdominal and transvaginal ultrasound examinations of the pelvis were performed. Transabdominal technique was performed for global imaging of the pelvis including uterus, ovaries, adnexal regions, and pelvic cul-de-sac. It was necessary to proceed with endovaginal exam following the transabdominal exam to visualize the endometrium.   COMPARISON:  None Available.   FINDINGS: Uterus   Measurements: 10.9 x 5.3 x 6.0 cm = volume: 182.4 mL. Uterus is anteverted. 4.6 x 3.9 x 3.1 cm intramural fibroid positioned at the right uterine fundus.   Endometrium   Thickness: 5 mm.  No focal abnormality visualized.   Right ovary   Measurements: 2.9 x 2.0 x 2.0 cm = volume: 6.0 mL. Normal appearance/no adnexal mass.   Left ovary   Measurements: 2.3 x 1.3 x 1.9 cm = volume: 3.0 mL. Normal appearance/no adnexal mass.   Other findings   No abnormal free fluid.   IMPRESSION: 1. Endometrial stripe within normal limits measuring 5 mm in thickness. If bleeding remains unresponsive to hormonal or medical therapy, sonohysterogram should be  considered for focal lesion work-up. (Ref: Radiological Reasoning: Algorithmic Workup of Abnormal Vaginal Bleeding with Endovaginal Sonography and Sonohysterography. AJR 2008; 808:D31-26). 2. 4.6 cm intramural fibroid positioned at the right uterine fundus. 3. Normal sonographic appearance of the ovaries. No adnexal mass or free fluid.     Electronically Signed   By: Morene Hoard M.D.   On: 08/06/2023 04:46   Microscopic wet-mount exam shows clue cells, trichomonads.   Assessment/Plan:   1. Iron  deficiency anemia due to chronic blood loss  - CBC - Iron , TIBC and Ferritin Panel  2. Menorrhagia with irregular cycle Discussed risks and benefits of Myfembree for treatment  Small fibroid does not need surgical management at this time. - Relugolix-Estradiol-Norethind (MYFEMBREE) 40-1-0.5 MG TABS; Take 1 tablet by mouth daily.  Dispense: 28 tablet; Refill: 2  3. Fibroids - Relugolix-Estradiol-Norethind (MYFEMBREE) 40-1-0.5 MG TABS; Take 1 tablet by mouth daily.  Dispense: 28 tablet; Refill: 2  4. Trichomoniasis - metroNIDAZOLE  (FLAGYL ) 500 MG tablet; Take 1 tablet (500 mg total) by mouth 2 (two) times daily.  Dispense: 14 tablet; Refill: 0  5. BV (bacterial vaginosis) - metroNIDAZOLE  (FLAGYL ) 500 MG tablet; Take 1 tablet (500 mg total) by mouth 2 (two) times daily.  Dispense: 14 tablet; Refill: 0   6. Screen for sexually transmitted diseases - SURESWAB CT/NG/T. vaginalis    Return in about 6 weeks (around 04/28/2024) for TOC.  GINETTE COZIER B WHNP-BC 10:27 AM 03/17/2024

## 2024-03-18 LAB — IRON,TIBC AND FERRITIN PANEL
%SAT: 29 % (ref 16–45)
Ferritin: 10 ng/mL — ABNORMAL LOW (ref 16–232)
Iron: 89 ug/dL (ref 40–190)
TIBC: 304 ug/dL (ref 250–450)

## 2024-03-18 LAB — CBC
HCT: 38.4 % (ref 35.0–45.0)
Hemoglobin: 12.3 g/dL (ref 11.7–15.5)
MCH: 29 pg (ref 27.0–33.0)
MCHC: 32 g/dL (ref 32.0–36.0)
MCV: 90.6 fL (ref 80.0–100.0)
MPV: 10.7 fL (ref 7.5–12.5)
Platelets: 354 Thousand/uL (ref 140–400)
RBC: 4.24 Million/uL (ref 3.80–5.10)
RDW: 12.4 % (ref 11.0–15.0)
WBC: 5.5 Thousand/uL (ref 3.8–10.8)

## 2024-03-18 LAB — SURESWAB CT/NG/T. VAGINALIS
C. trachomatis RNA, TMA: NOT DETECTED
N. gonorrhoeae RNA, TMA: NOT DETECTED
Trichomonas vaginalis RNA: DETECTED — AB

## 2024-03-20 ENCOUNTER — Ambulatory Visit: Payer: Self-pay | Admitting: Radiology

## 2024-05-01 ENCOUNTER — Telehealth: Admitting: Physician Assistant

## 2024-05-01 DIAGNOSIS — B9789 Other viral agents as the cause of diseases classified elsewhere: Secondary | ICD-10-CM | POA: Diagnosis not present

## 2024-05-01 DIAGNOSIS — J019 Acute sinusitis, unspecified: Secondary | ICD-10-CM | POA: Diagnosis not present

## 2024-05-01 MED ORDER — FLUTICASONE PROPIONATE 50 MCG/ACT NA SUSP: 2.0000 | Freq: Every day | NASAL | 0 refills | Status: DC

## 2024-05-01 NOTE — Progress Notes (Signed)
 E-Visit for Sinus Problems  We are sorry that you are not feeling well.  Here is how we plan to help!  Based on what you have shared with me it looks like you have sinusitis.  Sinusitis is inflammation and infection in the sinus cavities of the head.  Based on your presentation I believe you most likely have Acute Viral Sinusitis.This is an infection most likely caused by a virus. There is not specific treatment for viral sinusitis other than to help you with the symptoms until the infection runs its course.  You may use an oral decongestant such as Mucinex D or if you have glaucoma or high blood pressure use plain Mucinex. Saline nasal spray help and can safely be used as often as needed for congestion, I have prescribed: Fluticasone nasal spray two sprays in each nostril once a day  Some authorities believe that zinc sprays or the use of Echinacea may shorten the course of your symptoms.  Sinus infections are not as easily transmitted as other respiratory infection, however we still recommend that you avoid close contact with loved ones, especially the very young and elderly.  Remember to wash your hands thoroughly throughout the day as this is the number one way to prevent the spread of infection!  Home Care: Only take medications as instructed by your medical team. Do not take these medications with alcohol. A steam or ultrasonic humidifier can help congestion.  You can place a towel over your head and breathe in the steam from hot water coming from a faucet. Avoid close contacts especially the very young and the elderly. Cover your mouth when you cough or sneeze. Always remember to wash your hands.  Get Help Right Away If: You develop worsening fever or sinus pain. You develop a severe head ache or visual changes. Your symptoms persist after you have completed your treatment plan.  Make sure you Understand these instructions. Will watch your condition. Will get help right away if you  are not doing well or get worse.   Thank you for choosing an e-visit.  Your e-visit answers were reviewed by a board certified advanced clinical practitioner to complete your personal care plan. Depending upon the condition, your plan could have included both over the counter or prescription medications.  Please review your pharmacy choice. Make sure the pharmacy is open so you can pick up prescription now. If there is a problem, you may contact your provider through [employer] and have the prescription routed to another pharmacy.  Your safety is important to us . If you have drug allergies check your prescription carefully.   For the next 24 hours you can use MyChart to ask questions about today's visit, request a non-urgent call back, or ask for a work or school excuse. You will get an email in the next two days asking about your experience. I hope that your e-visit has been valuable and will speed your recovery.   I have spent 5 minutes in review of e-visit questionnaire, review and updating patient chart, medical decision making and response to patient.   Delon CHRISTELLA Dickinson, PA-C

## 2024-05-03 ENCOUNTER — Ambulatory Visit: Admitting: Radiology

## 2024-05-16 ENCOUNTER — Ambulatory Visit: Admitting: Radiology

## 2024-05-23 ENCOUNTER — Ambulatory Visit (INDEPENDENT_AMBULATORY_CARE_PROVIDER_SITE_OTHER): Admitting: Radiology

## 2024-05-23 ENCOUNTER — Encounter: Payer: Self-pay | Admitting: Radiology

## 2024-05-23 VITALS — BP 104/68 | Wt 203.0 lb

## 2024-05-23 DIAGNOSIS — N921 Excessive and frequent menstruation with irregular cycle: Secondary | ICD-10-CM | POA: Diagnosis not present

## 2024-05-23 DIAGNOSIS — N760 Acute vaginitis: Secondary | ICD-10-CM | POA: Diagnosis not present

## 2024-05-23 DIAGNOSIS — D219 Benign neoplasm of connective and other soft tissue, unspecified: Secondary | ICD-10-CM | POA: Diagnosis not present

## 2024-05-23 DIAGNOSIS — A599 Trichomoniasis, unspecified: Secondary | ICD-10-CM

## 2024-05-23 DIAGNOSIS — B9689 Other specified bacterial agents as the cause of diseases classified elsewhere: Secondary | ICD-10-CM

## 2024-05-23 LAB — WET PREP FOR TRICH, YEAST, CLUE

## 2024-05-23 MED ORDER — METRONIDAZOLE 500 MG PO TABS: 500.0000 mg | ORAL_TABLET | Freq: Two times a day (BID) | ORAL | 0 refills | Status: AC

## 2024-05-23 MED ORDER — MYFEMBREE 40-1-0.5 MG PO TABS: 1.0000 | ORAL_TABLET | Freq: Every day | ORAL | 2 refills | Status: AC

## 2024-05-23 NOTE — Progress Notes (Signed)
      Subjective: Natalie Orr is a 43 y.o. female here for TOC trich. Treated 03/17/24, unsure if partner was treated.Patient never started Myfembree  (unsure how to take--patient has sample but did not pick up meds at pharmacy).    Review of Systems  All other systems reviewed and are negative.   Past Medical History:  Diagnosis Date   Amenorrhea 10/28/2021   Anemia    Fibroids       Objective:  Today's Vitals   05/23/24 0925  BP: 104/68  Weight: 203 lb (92.1 kg)   Body mass index is 40.31 kg/m.   Physical Exam Vitals and nursing note reviewed. Exam conducted with a chaperone present.  Constitutional:      Appearance: Normal appearance. She is well-developed.  Pulmonary:     Effort: Pulmonary effort is normal.  Abdominal:     General: Abdomen is flat.     Palpations: Abdomen is soft.  Genitourinary:    General: Normal vulva.     Vagina: Vaginal discharge present. No erythema, bleeding or lesions.     Cervix: Normal. No discharge, friability, lesion or erythema.     Uterus: Normal.      Adnexa: Right adnexa normal and left adnexa normal.  Neurological:     Mental Status: She is alert.  Psychiatric:        Mood and Affect: Mood normal.        Thought Content: Thought content normal.        Judgment: Judgment normal.    Microscopic wet-mount exam shows clue cells.   Darice Hoit, CMA present for exam  Assessment:/Plan:  1. Trichomoniasis (Primary) - WET PREP FOR TRICH, YEAST, CLUE  2. BV (bacterial vaginosis) - metroNIDAZOLE  (FLAGYL ) 500 MG tablet; Take 1 tablet (500 mg total) by mouth 2 (two) times daily.  Dispense: 14 tablet; Refill: 0  3. Menorrhagia with irregular cycle - Relugolix-Estradiol-Norethind (MYFEMBREE ) 40-1-0.5 MG TABS; Take 1 tablet by mouth daily.  Dispense: 28 tablet; Refill: 2  4. Fibroids - Relugolix-Estradiol-Norethind (MYFEMBREE ) 40-1-0.5 MG TABS; Take 1 tablet by mouth daily.  Dispense: 28 tablet; Refill: 2      Aleesia Henney B, NP 9:40 AM

## 2024-05-27 ENCOUNTER — Telehealth

## 2024-05-27 DIAGNOSIS — J069 Acute upper respiratory infection, unspecified: Secondary | ICD-10-CM | POA: Diagnosis not present

## 2024-05-28 MED ORDER — BENZONATATE 100 MG PO CAPS: 100.0000 mg | ORAL_CAPSULE | Freq: Three times a day (TID) | ORAL | 0 refills | Status: AC | PRN

## 2024-05-28 MED ORDER — FLUTICASONE PROPIONATE 50 MCG/ACT NA SUSP: 2.0000 | Freq: Every day | NASAL | 6 refills | Status: AC

## 2024-05-28 NOTE — Progress Notes (Signed)
 We are sorry you are not feeling well.  Here is how we plan to help!  Based on what you have shared with me, it looks like you may have a viral upper respiratory infection.  Upper respiratory infections are caused by a large number of viruses; however, rhinovirus is the most common cause.   Symptoms vary from person to person, with common symptoms including sore throat, cough, and fatigue or lack of energy.  A low-grade fever of up to 100.4 may present, but is often uncommon.  Symptoms vary however, and are closely related to a person's age or underlying illnesses.  The most common symptoms associated with an upper respiratory infection are nasal discharge or congestion, cough, sneezing, headache and pressure in the ears and face.  These symptoms usually persist for about 3 to 10 days, but can last up to 2 weeks.  It is important to know that upper respiratory infections do not cause serious illness or complications in most cases.    Upper respiratory infections can be transmitted from person to person, with the most common method of transmission being a person's hands.  The virus is able to live on the skin and can infect other persons for up to 2 hours after direct contact.  Also, these can be transmitted when someone coughs or sneezes; thus, it is important to cover the mouth to reduce this risk.  To keep the spread of the illness at bay, good hand hygiene is very important.  This is an infection that is most likely caused by a virus. There are no specific treatments other than to help you with the symptoms until the infection runs its course.  We are sorry you are not feeling well.  Here is how we plan to help!   For nasal congestion, you may use an oral decongestants such as Mucinex  D or if you have glaucoma or high blood pressure use plain Mucinex .  Saline nasal spray or nasal drops can help and can safely be used as often as needed for congestion.  For your congestion, I have prescribed Fluticasone   nasal spray one spray in each nostril twice a day  If you do not have a history of heart disease, hypertension, diabetes or thyroid disease, prostate/bladder issues or glaucoma, you may also use Sudafed to treat nasal congestion.  It is highly recommended that you consult with a pharmacist or your primary care physician to ensure this medication is safe for you to take.     If you have a cough, you may use cough suppressants such as Delsym and Robitussin.  If you have glaucoma or high blood pressure, you can also use Coricidin HBP.   For cough I have prescribed for you A prescription cough medication called Tessalon  Perles 100 mg. You may take 1-2 capsules every 8 hours as needed for cough  If you have a sore or scratchy throat, use a saltwater gargle-  to  teaspoon of salt dissolved in a 4-ounce to 8-ounce glass of warm water.  Gargle the solution for approximately 15-30 seconds and then spit.  It is important not to swallow the solution.  You can also use throat lozenges/cough drops and Chloraseptic spray to help with throat pain or discomfort.  Warm or cold liquids can also be helpful in relieving throat pain.  For headache, pain or general discomfort, you can use Ibuprofen  or Tylenol  as directed.   Some authorities believe that zinc sprays or the use of Echinacea may shorten the  course of your symptoms.   HOME CARE Only take medications as instructed by your medical team. Be sure to drink plenty of fluids. Water is fine as well as fruit juices, sodas and electrolyte beverages. You may want to stay away from caffeine or alcohol. If you are nauseated, try taking small sips of liquids. How do you know if you are getting enough fluid? Your urine should be a pale yellow or almost colorless. Get rest. Taking a steamy shower or using a humidifier may help nasal congestion and ease sore throat pain. You can place a towel over your head and breathe in the steam from hot water coming from a  faucet. Using a saline nasal spray works much the same way. Cough drops, hard candies and sore throat lozenges may ease your cough. Avoid close contacts especially the very young and the elderly Cover your mouth if you cough or sneeze Always remember to wash your hands.   GET HELP RIGHT AWAY IF: You develop worsening fever. If your symptoms do not improve within 10 days You develop yellow or green discharge from your nose over 3 days. You have coughing fits You develop a severe head ache or visual changes. You develop shortness of breath, difficulty breathing or start having chest pain Your symptoms persist after you have completed your treatment plan  MAKE SURE YOU  Understand these instructions. Will watch your condition. Will get help right away if you are not doing well or get worse.  Your e-visit answers were reviewed by a board certified advanced clinical practitioner to complete your personal care plan. Depending upon the condition, your plan could have included both over the counter or prescription medications. Please review your pharmacy choice. If there is a problem, you may call our nursing hot line at and have the prescription routed to another pharmacy. Your safety is important to us . If you have drug allergies check your prescription carefully.   You can use MyChart to ask questions about today's visit, request a non-urgent call back, or ask for a work or school excuse for 24 hours related to this e-Visit. If it has been greater than 24 hours you will need to follow up with your provider, or enter a new e-Visit to address those concerns. You will get an e-mail in the next two days asking about your experience.  I hope that your e-visit has been valuable and will speed your recovery. Thank you for using e-visits.   I have spent 5 minutes in review of e-visit questionnaire, review and updating patient chart, medical decision making and response to patient.   Bari Learn,  FNP
